# Patient Record
Sex: Female | Born: 1955
Health system: Southern US, Community
[De-identification: ages and names within clinical notes are randomized; demographics above are authoritative.]

## PROBLEM LIST (undated history)

## (undated) DIAGNOSIS — K219 Gastro-esophageal reflux disease without esophagitis: Secondary | ICD-10-CM

## (undated) DIAGNOSIS — C801 Malignant (primary) neoplasm, unspecified: Secondary | ICD-10-CM

## (undated) DIAGNOSIS — E785 Hyperlipidemia, unspecified: Secondary | ICD-10-CM

## (undated) HISTORY — DX: Malignant (primary) neoplasm, unspecified: C80.1

## (undated) HISTORY — DX: Gastro-esophageal reflux disease without esophagitis: K21.9

## (undated) HISTORY — DX: Hyperlipidemia, unspecified: E78.5

---

## 2010-10-15 ENCOUNTER — Encounter: Payer: Self-pay | Admitting: Family Medicine

## 2010-10-15 ENCOUNTER — Ambulatory Visit (INDEPENDENT_AMBULATORY_CARE_PROVIDER_SITE_OTHER): Payer: Self-pay | Admitting: Family Medicine

## 2010-10-15 VITALS — BP 109/71 | HR 75 | Ht 59.0 in | Wt 108.0 lb

## 2010-10-15 DIAGNOSIS — R319 Hematuria, unspecified: Secondary | ICD-10-CM

## 2010-10-15 DIAGNOSIS — Z23 Encounter for immunization: Secondary | ICD-10-CM

## 2010-10-15 DIAGNOSIS — Z1231 Encounter for screening mammogram for malignant neoplasm of breast: Secondary | ICD-10-CM

## 2010-10-15 DIAGNOSIS — R0789 Other chest pain: Secondary | ICD-10-CM

## 2010-10-15 LAB — POCT URINALYSIS DIPSTICK
Nitrite, UA: NEGATIVE
Protein, UA: 30
Urobilinogen, UA: 0.2
pH, UA: 6

## 2010-10-15 NOTE — Patient Instructions (Signed)
Can try zantac 75mg  twice a day for 2 weeks and see if helps your chest pain Avoid greasy or spicey foods. Avoid caffeine or chocolate or carbonated beverages.

## 2010-10-15 NOTE — Progress Notes (Signed)
Subjective:    Patient ID: Haley Francis, female    DOB: [birth year], 55 y.o.   MRN: [id number]  HPI Left and mid chest pain that like a pressure.  Occuring for about a month.  Pain is intermittant and can last 2-5 mintues. No SOB. No sweats. Can happen at rest or activity.  Mom with high blood pressure.  It varies, not necesaril happens after eating.  Never had cholesterol check. He can happen once or twice a week. If not daily. No other worsening or alleviating symptoms that she is noticed. She denies any heartburn-like symptoms. She does speak some English but her daughter is here with her today helping translate. She denies any blood in her stool but did admit to some blood after urination when she wipes. She says sometimes it will paper is pink tinged.   Review of Systems  Constitutional: Negative for fever, diaphoresis and unexpected weight change.  HENT: Negative for hearing loss, rhinorrhea and tinnitus.   Eyes: Negative for visual disturbance.  Respiratory: Negative for cough and wheezing.   Cardiovascular: Negative for chest pain and palpitations.  Gastrointestinal: Negative for nausea, vomiting, diarrhea and blood in stool.  Genitourinary: Negative for vaginal bleeding, vaginal discharge and difficulty urinating.  Musculoskeletal: Negative for myalgias and arthralgias.  Skin: Negative for rash.  Neurological: Negative for headaches.  Hematological: Negative for adenopathy. Does not bruise/bleed easily.  Psychiatric/Behavioral: Negative for sleep disturbance and dysphoric mood. The patient is not nervous/anxious.        BP 109/71  Pulse 75  Ht 4\' 11"  (1.499 m)  Wt 108 lb (48.988 kg)  BMI 21.81 kg/m2    No Known Allergies  History reviewed. No pertinent past medical history.  History reviewed. No pertinent past surgical history.  History   Social History  . Marital Status: Married    Spouse Name: Safiyah Cisney    Number of Children: 2  . Years of Education: HA    Occupational History  . Manicurist     [employer]   Social History Main Topics  . Smoking status: Never Smoker   . Smokeless tobacco: Not on file  . Alcohol Use: 0.6 oz/week    1 Cans of beer per week  . Drug Use: No  . Sexually Active: Yes -- Female partner(s)   Other Topics Concern  . Not on file   Social History Narrative   Exercise occasional.    Family History  Problem Relation Age of Onset  . Hypertension Mother     Ms. Rozycki does not currently have medications on file.  Objective:   Physical Exam  Constitutional: She is oriented to person, place, and time.  HENT:  Head: Normocephalic and atraumatic.  Mouth/Throat: Oropharynx is clear and moist.  Eyes: Conjunctivae are normal. Pupils are equal, round, and reactive to light.  Neck: Neck supple. No thyromegaly present.  Cardiovascular: Normal rate, regular rhythm and normal heart sounds.        No carotid bruits. No abdominal bruits. DP pulses 2+ bilat. Radial pulses 2+ bilat.   Pulmonary/Chest: Effort normal and breath sounds normal.  Abdominal: Soft. Bowel sounds are normal. She exhibits no distension and no mass. There is no tenderness. There is no rebound and no guarding.  Musculoskeletal: She exhibits no edema.  Lymphadenopathy:    She has no cervical adenopathy.  Neurological: She is alert and oriented to person, place, and time.  Skin: Skin is warm and dry.  Psychiatric: She has a normal mood and  affect. Her behavior is normal. Judgment and thought content normal.          Assessment & Plan:  Atypical Chest Pain-she is fairly low risk. No family hx. BP is well controlled. Will check cholesterol and CMP. EKG today shows weight 60 beats per minute with normal sinus rhythm. No evidence of acute changes. She does have some poor R. wave progression in the lateral leads. She has no premature family history of heart disease. Consider this also could be GERD. I did give her some instructions to try Zantac 75  milligrams twice a day for one to 2 weeks to see if that improves her symptoms.   Hematuria - UA is positive for blood. Sent for culture. If this is negative then she will need further workup for her hematuria.Marland Kitchen

## 2010-10-16 LAB — COMPLETE METABOLIC PANEL WITH GFR
ALT: 20 U/L (ref 0–35)
AST: 23 U/L (ref 0–37)
Alkaline Phosphatase: 69 U/L (ref 39–117)
BUN: 15 mg/dL (ref 6–23)
Calcium: 9.3 mg/dL (ref 8.4–10.5)
Chloride: 105 mEq/L (ref 96–112)
Creat: 0.6 mg/dL (ref 0.50–1.10)
Total Bilirubin: 0.3 mg/dL (ref 0.3–1.2)

## 2010-10-16 LAB — LIPID PANEL
Cholesterol: 217 mg/dL — ABNORMAL HIGH (ref 0–200)
HDL: 43 mg/dL (ref >39)
Triglycerides: 110 mg/dL (ref <150)
VLDL: 22 mg/dL (ref 0–40)

## 2010-10-17 ENCOUNTER — Telehealth: Payer: Self-pay | Admitting: *Deleted

## 2010-10-17 NOTE — Telephone Encounter (Signed)
Notified pt via her daughter as pt does not understand english very well. KJ LPN

## 2010-10-17 NOTE — Telephone Encounter (Signed)
Message copied by Lanae Crumbly on Wed Oct 17, 2010 10:21 AM ------      Message from: Nani Gasser D      Created: Tue Oct 16, 2010  1:06 PM       Complete metabolic panel is normal. Sugar, liver and kidney function are normal. Her total cholesterol and LDL cholesterol are elevated. I would like her to work on low-fat diet and regular exercise .  She can start with brisk walking for 30 minutes 5 days per week did not like to recheck her LDL in 4 months. Thyroid looks great. We'll have the results back on her urine culture yet but we will call her with that.

## 2010-10-22 ENCOUNTER — Other Ambulatory Visit: Payer: Self-pay | Admitting: Family Medicine

## 2010-10-22 LAB — URINE CULTURE: Colony Count: >=100000

## 2010-10-22 MED ORDER — NITROFURANTOIN MONOHYD MACRO 100 MG PO CAPS: 100.0000 mg | ORAL_CAPSULE | Freq: Two times a day (BID) | ORAL | Status: AC

## 2010-10-23 ENCOUNTER — Telehealth: Payer: Self-pay | Admitting: *Deleted

## 2010-10-23 NOTE — Telephone Encounter (Signed)
Pt daughter calls and states mom is having chest pain left sided, SOB, started yesterday. Feels like pressure on the chest. Instructed daughter to take her to the ED and daughter agrees. Just giving you FYI on what was instructed.

## 2010-11-13 ENCOUNTER — Inpatient Hospital Stay: Admission: RE | Admit: 2010-11-13 | Payer: BC Managed Care – PPO | Source: Ambulatory Visit

## 2010-12-11 ENCOUNTER — Ambulatory Visit: Payer: BC Managed Care – PPO

## 2013-02-10 ENCOUNTER — Ambulatory Visit: Payer: BC Managed Care – PPO | Admitting: Physician Assistant

## 2013-02-17 ENCOUNTER — Encounter: Payer: Self-pay | Admitting: Physician Assistant

## 2013-02-17 ENCOUNTER — Ambulatory Visit (INDEPENDENT_AMBULATORY_CARE_PROVIDER_SITE_OTHER): Payer: BC Managed Care – PPO

## 2013-02-17 ENCOUNTER — Ambulatory Visit (INDEPENDENT_AMBULATORY_CARE_PROVIDER_SITE_OTHER): Payer: BC Managed Care – PPO | Admitting: Physician Assistant

## 2013-02-17 VITALS — BP 115/69 | HR 71 | Ht 59.0 in | Wt 110.0 lb

## 2013-02-17 DIAGNOSIS — R21 Rash and other nonspecific skin eruption: Secondary | ICD-10-CM

## 2013-02-17 DIAGNOSIS — Z1239 Encounter for other screening for malignant neoplasm of breast: Secondary | ICD-10-CM

## 2013-02-17 DIAGNOSIS — C189 Malignant neoplasm of colon, unspecified: Secondary | ICD-10-CM

## 2013-02-17 DIAGNOSIS — R7301 Impaired fasting glucose: Secondary | ICD-10-CM

## 2013-02-17 DIAGNOSIS — R0781 Pleurodynia: Secondary | ICD-10-CM

## 2013-02-17 DIAGNOSIS — Z131 Encounter for screening for diabetes mellitus: Secondary | ICD-10-CM

## 2013-02-17 DIAGNOSIS — R079 Chest pain, unspecified: Secondary | ICD-10-CM

## 2013-02-17 DIAGNOSIS — Z23 Encounter for immunization: Secondary | ICD-10-CM

## 2013-02-17 DIAGNOSIS — Z13220 Encounter for screening for lipoid disorders: Secondary | ICD-10-CM

## 2013-02-17 LAB — COMPLETE METABOLIC PANEL WITH GFR
ALBUMIN: 4.5 g/dL (ref 3.5–5.2)
ALT: 21 U/L (ref 0–35)
AST: 20 U/L (ref 0–37)
Alkaline Phosphatase: 62 U/L (ref 39–117)
BILIRUBIN TOTAL: 0.4 mg/dL (ref 0.2–1.2)
BUN: 11 mg/dL (ref 6–23)
CO2: 27 mEq/L (ref 19–32)
Calcium: 9.7 mg/dL (ref 8.4–10.5)
Chloride: 106 mEq/L (ref 96–112)
Creat: 0.62 mg/dL (ref 0.50–1.10)
GLUCOSE: 100 mg/dL — AB (ref 70–99)
Potassium: 5.3 mEq/L (ref 3.5–5.3)
Sodium: 142 mEq/L (ref 135–145)
Total Protein: 7.3 g/dL (ref 6.0–8.3)

## 2013-02-17 LAB — LIPID PANEL
Cholesterol: 234 mg/dL — ABNORMAL HIGH (ref 0–200)
HDL: 47 mg/dL (ref >39)
LDL Cholesterol: 145 mg/dL — ABNORMAL HIGH (ref 0–99)
Total CHOL/HDL Ratio: 5 Ratio
Triglycerides: 212 mg/dL — ABNORMAL HIGH (ref <150)
VLDL: 42 mg/dL — ABNORMAL HIGH (ref 0–40)

## 2013-02-17 MED ORDER — TRIAMCINOLONE ACETONIDE 0.5 % EX OINT: 1.0000 "application " | TOPICAL_OINTMENT | Freq: Two times a day (BID) | CUTANEOUS | Status: DC

## 2013-02-17 NOTE — Patient Instructions (Addendum)
Vitamin D [id number] Calicum 1200mg    Schedule CPE in next 4-6 weeks.

## 2013-02-17 NOTE — Progress Notes (Signed)
Subjective:    Patient ID: Haley Francis, female    DOB: [birth year], 58 y.o.   MRN: [id number]  HPI Patient is a 58 year old Asian female who presents to the clinic with her daughter to establish care today. Patient has not seen the doctor in a few years and knows she is in need for health maintenance items. Patient only takes a multivitamin. She has had cholesterol done in the past and was borderline but has not needed to start any medication. She does not remember her last mammogram or Pap smear. She has not had a colonoscopy yet. She denies any fractures or family history of osteoporosis.  . Active Ambulatory Problems    Diagnosis Date Noted  . No Active Ambulatory Problems   Resolved Ambulatory Problems    Diagnosis Date Noted  . No Resolved Ambulatory Problems   No Additional Past Medical History   . Family History  Problem Relation Age of Onset  . Hypertension Mother   . Diabetes Mother   . Hyperlipidemia Mother     . History   Social History  . Marital Status: Married    Spouse Name: Blanca Carreon    Number of Children: 2  . 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 Smoking status: Never Smoker   . Smokeless tobacco: Not on file  . Alcohol Use: No  . Drug Use: No  . Sexual Activity: Not on file   Other Topics Concern  . Not on file   Social History Narrative   Exercise occasional.    patient does have 2 concerns she would like addressed today. She's had on and off right rib pain. This pain is resolved today. She has no trigger for when it comes and goes. When it is there any discharge and tender to touch and then it will go away. Denies any nausea or vomiting associated. Tried anything to make better and nothing seems to make worse. This has been going on for approximately a year.   Patient also has an itchy rash of her right lower anterior shin. It has been present for approximately a month. It  is very itchy. Hot bath seems to make worse. She has not tried anything to make better. She denies any pain or burning sensation. She has not tried any new detergents or soaps. She does not really use any lotions. She denies any other rashes anywhere else on her body.   Review of Systems  All other systems reviewed and are negative.       Objective:   Physical Exam  Constitutional: She is oriented to person, place, and time. She appears well-developed and well-nourished.  HENT:  Head: Normocephalic and atraumatic.  Cardiovascular: Normal rate, regular rhythm and normal heart sounds.   Pulmonary/Chest: Effort normal and breath sounds normal.  No pain to palpation over either rib cage or intercostal spaces to palpation.  Neurological: She is alert and oriented to person, place, and time.  Skin: Skin is dry.     Psychiatric: She has a normal mood and affect. Her behavior is normal.          Assessment & Plan:  Establish- will get a CMP as well as lipid levels done today. Went ahead and ordered colonoscopy and mammogram. Patient has not own for the last 12 months. Patient denies any feelings of hopelessness or helplessness and scored 0/2 on PHQ-2. Needs  CPE in next 4 weeks.   Right sided rib pain- likely a normal variant will get rib x-rays today to rule out any masses. Will call patient with results. Followup if pain becomes more constant or changes.  Rash- I will treat for eczema like rash with triamcinolone. If not improving in next 2 weeks follow up. Call if worsens. Gave HO for eczema. Decrease hot showers and baths. Instructed pt to use cetaphil and non drying lotions.    Tdap given today without complications.

## 2013-02-22 NOTE — Addendum Note (Signed)
Addended by: Jamesetta So on: 02/22/2013 08:49 AM   Modules accepted: Orders

## 2013-02-25 ENCOUNTER — Ambulatory Visit (INDEPENDENT_AMBULATORY_CARE_PROVIDER_SITE_OTHER): Payer: BC Managed Care – PPO

## 2013-02-25 DIAGNOSIS — Z1239 Encounter for other screening for malignant neoplasm of breast: Secondary | ICD-10-CM

## 2013-02-25 DIAGNOSIS — Z1231 Encounter for screening mammogram for malignant neoplasm of breast: Secondary | ICD-10-CM

## 2013-02-25 DIAGNOSIS — R928 Other abnormal and inconclusive findings on diagnostic imaging of breast: Secondary | ICD-10-CM

## 2013-02-25 LAB — HEMOGLOBIN A1C
Hgb A1c MFr Bld: 6.2 % — ABNORMAL HIGH (ref <5.7)
MEAN PLASMA GLUCOSE: 131 mg/dL — AB (ref <117)

## 2013-02-28 ENCOUNTER — Encounter: Payer: Self-pay | Admitting: Physician Assistant

## 2013-02-28 DIAGNOSIS — R928 Other abnormal and inconclusive findings on diagnostic imaging of breast: Secondary | ICD-10-CM | POA: Insufficient documentation

## 2013-03-01 ENCOUNTER — Other Ambulatory Visit: Payer: Self-pay | Admitting: Physician Assistant

## 2013-03-01 DIAGNOSIS — R928 Other abnormal and inconclusive findings on diagnostic imaging of breast: Secondary | ICD-10-CM

## 2013-03-19 ENCOUNTER — Encounter: Payer: Self-pay | Admitting: Physician Assistant

## 2013-03-19 DIAGNOSIS — D126 Benign neoplasm of colon, unspecified: Secondary | ICD-10-CM | POA: Insufficient documentation

## 2013-03-29 ENCOUNTER — Other Ambulatory Visit: Payer: BC Managed Care – PPO

## 2013-04-08 ENCOUNTER — Ambulatory Visit
Admission: RE | Admit: 2013-04-08 | Discharge: 2013-04-08 | Disposition: A | Discharge status: Home / Self Care | Payer: BC Managed Care – PPO | Source: Ambulatory Visit | Attending: Physician Assistant | Admitting: Physician Assistant

## 2013-04-08 DIAGNOSIS — R928 Other abnormal and inconclusive findings on diagnostic imaging of breast: Secondary | ICD-10-CM

## 2013-04-09 ENCOUNTER — Encounter: Payer: Self-pay | Admitting: Physician Assistant

## 2013-04-14 ENCOUNTER — Encounter: Payer: Self-pay | Admitting: Physician Assistant

## 2013-06-23 ENCOUNTER — Encounter: Payer: Self-pay | Admitting: Physician Assistant

## 2013-06-23 ENCOUNTER — Other Ambulatory Visit (HOSPITAL_COMMUNITY)
Admission: RE | Admit: 2013-06-23 | Discharge: 2013-06-23 | Disposition: A | Discharge status: Home / Self Care | Payer: BC Managed Care – PPO | Source: Ambulatory Visit | Attending: Family Medicine | Admitting: Family Medicine

## 2013-06-23 ENCOUNTER — Ambulatory Visit (INDEPENDENT_AMBULATORY_CARE_PROVIDER_SITE_OTHER): Payer: BC Managed Care – PPO | Admitting: Physician Assistant

## 2013-06-23 VITALS — BP 92/61 | HR 66 | Ht 59.0 in | Wt 106.0 lb

## 2013-06-23 DIAGNOSIS — N760 Acute vaginitis: Secondary | ICD-10-CM | POA: Insufficient documentation

## 2013-06-23 DIAGNOSIS — L293 Anogenital pruritus, unspecified: Secondary | ICD-10-CM

## 2013-06-23 DIAGNOSIS — Z13820 Encounter for screening for osteoporosis: Secondary | ICD-10-CM

## 2013-06-23 DIAGNOSIS — R7309 Other abnormal glucose: Secondary | ICD-10-CM

## 2013-06-23 DIAGNOSIS — Z01419 Encounter for gynecological examination (general) (routine) without abnormal findings: Secondary | ICD-10-CM

## 2013-06-23 DIAGNOSIS — R87810 Cervical high risk human papillomavirus (HPV) DNA test positive: Secondary | ICD-10-CM | POA: Insufficient documentation

## 2013-06-23 DIAGNOSIS — Z1151 Encounter for screening for human papillomavirus (HPV): Secondary | ICD-10-CM | POA: Insufficient documentation

## 2013-06-23 DIAGNOSIS — N898 Other specified noninflammatory disorders of vagina: Secondary | ICD-10-CM

## 2013-06-23 DIAGNOSIS — Z0000 Encounter for general adult medical examination without abnormal findings: Secondary | ICD-10-CM

## 2013-06-23 DIAGNOSIS — Z124 Encounter for screening for malignant neoplasm of cervix: Secondary | ICD-10-CM | POA: Insufficient documentation

## 2013-06-23 DIAGNOSIS — R7303 Prediabetes: Secondary | ICD-10-CM | POA: Insufficient documentation

## 2013-06-23 LAB — POCT GLYCOSYLATED HEMOGLOBIN (HGB A1C): Hemoglobin A1C: 6.1

## 2013-06-23 NOTE — Patient Instructions (Signed)

## 2013-06-24 LAB — CYTOLOGY - PAP

## 2013-06-25 NOTE — Progress Notes (Signed)
  Subjective:     Haley Francis is a 58 y.o. female and is here for a comprehensive physical exam. The patient reports vaginal itching for past couple of days. no pain with urination, no discharge, no abdominal or flank pain. place alcohol in vagina and did not make worse but did not help. Marland Kitchen  History   Social History  . Marital Status: Married    Spouse Name: Dinara Lupu    Number of Children: 2  . 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 Smoking status: Never Smoker   . Smokeless tobacco: Not on file  . Alcohol Use: No  . Drug Use: No  . Sexual Activity: Not on file   Other Topics Concern  . Not on file   Social History Narrative   Exercise occasional.   Health Maintenance  Topic Date Due  . Influenza Vaccine  08/14/2013  . Mammogram  04/09/2014  . Pap Smear  06/23/2016  . Colonoscopy  03/17/2018  . Tetanus/tdap  02/18/2023    The following portions of the patient's history were reviewed and updated as appropriate: allergies, current medications, past family history, past medical history, past social history, past surgical history and problem list.  Review of Systems A comprehensive review of systems was negative.   Objective:    BP 92/61  Pulse 66  Ht 4\' 11"  (1.499 m)  Wt 106 lb (48.081 kg)  BMI 21.40 kg/m2 General appearance: alert, cooperative and appears stated age Head: Normocephalic, without obvious abnormality, atraumatic Eyes: conjunctivae/corneas clear. PERRL, EOM's intact. Fundi benign. Ears: normal TM's and external ear canals both ears Nose: Nares normal. Septum midline. Mucosa normal. No drainage or sinus tenderness. Throat: lips, mucosa, and tongue normal; teeth and gums normal Neck: no adenopathy, no carotid bruit, no JVD, supple, symmetrical, trachea midline and thyroid not enlarged, symmetric, no tenderness/mass/nodules Back: symmetric, no curvature. ROM normal. No CVA  tenderness. Lungs: clear to auscultation bilaterally Breasts: normal appearance, no masses or tenderness Heart: regular rate and rhythm, S1, S2 normal, no murmur, click, rub or gallop Abdomen: soft, non-tender; bowel sounds normal; no masses,  no organomegaly Pelvic: cervix normal in appearance, external genitalia normal, no adnexal masses or tenderness, no cervical motion tenderness, uterus normal size, shape, and consistency and vagina normal without discharge Extremities: extremities normal, atraumatic, no cyanosis or edema Pulses: 2+ and symmetric Skin: Skin color, texture, turgor normal. No rashes or lesions Lymph nodes: Cervical, supraclavicular, and axillary nodes normal. Neurologic: Grossly normal    Assessment:    Healthy female exam.      Plan:    CPE- pap done today. Added wet prep. Discussed itching could be more hormonal and from dryness. Alcohol would only make worse. Suggested OTC estoven or coconut oil. Follow up if not improving or worsening. Patient has an up to date colonoscopy and mammogram. Pt has never had bone density will order today. Continue on calcium and vitamin D as already on via med list. Continue regular exercise and followup as needed or in one year.  Pre-diabetes- .Marland Kitchen Lab Results  Component Value Date   HGBA1C 6.1 06/23/2013   discussed with pt slightly better from last visit. Continue to work on decreasing carbs/sugars and exercise. Will monitor every 6 months to a year.  See After Visit Summary for Counseling Recommendations

## 2013-06-29 ENCOUNTER — Other Ambulatory Visit: Payer: Self-pay | Admitting: Physician Assistant

## 2013-06-29 DIAGNOSIS — IMO0002 Reserved for concepts with insufficient information to code with codable children: Secondary | ICD-10-CM

## 2013-12-03 ENCOUNTER — Other Ambulatory Visit: Payer: Self-pay | Admitting: Physician Assistant

## 2013-12-03 ENCOUNTER — Encounter: Payer: Self-pay | Admitting: Physician Assistant

## 2013-12-03 ENCOUNTER — Ambulatory Visit (INDEPENDENT_AMBULATORY_CARE_PROVIDER_SITE_OTHER): Payer: BC Managed Care – PPO | Admitting: Physician Assistant

## 2013-12-03 VITALS — BP 112/69 | HR 82 | Ht 59.0 in | Wt 101.0 lb

## 2013-12-03 DIAGNOSIS — R7303 Prediabetes: Secondary | ICD-10-CM

## 2013-12-03 DIAGNOSIS — R7309 Other abnormal glucose: Secondary | ICD-10-CM

## 2013-12-03 DIAGNOSIS — R21 Rash and other nonspecific skin eruption: Secondary | ICD-10-CM | POA: Diagnosis not present

## 2013-12-03 NOTE — Patient Instructions (Addendum)
Biopsy Care After Refer to this sheet in the next few weeks. These instructions provide you with information on caring for yourself after your procedure. Your caregiver may also give you more specific instructions. Your treatment has been planned according to current medical practices, but problems sometimes occur. Call your caregiver if you have any problems or questions after your procedure. If you had a fine needle biopsy, you may have soreness at the biopsy site for 1 to 2 days. If you had an open biopsy, you may have soreness at the biopsy site for 3 to 4 days. HOME CARE INSTRUCTIONS   You may resume normal diet and activities as directed.  Change bandages (dressings) as directed. If your wound was closed with a skin glue (adhesive), it will wear off and begin to peel in 7 days.  Only take over-the-counter or prescription medicines for pain, discomfort, or fever as directed by your caregiver.  Ask your caregiver when you can bathe and get your wound wet. SEEK IMMEDIATE MEDICAL CARE IF:   You have increased bleeding (more than a small spot) from the biopsy site.  You notice redness, swelling, or increasing pain at the biopsy site.  You have pus coming from the biopsy site.  You have a fever.  You notice a bad smell coming from the biopsy site or dressing.  You have a rash, have difficulty breathing, or have any allergic problems. MAKE SURE YOU:   Understand these instructions.  Will watch your condition.  Will get help right away if you are not doing well or get worse. Document Released: 07/20/2004 Document Revised: 03/25/2011 Document Reviewed: 06/28/2010 ExitCare Patient Information 2015 [employer]. This information is not intended to replace advice given to you by your health care provider. Make sure you discuss any questions you have with your health care provider.  

## 2013-12-04 LAB — HEMOGLOBIN A1C
Hgb A1c MFr Bld: 6.2 % — ABNORMAL HIGH (ref <5.7)
Mean Plasma Glucose: 131 mg/dL — ABNORMAL HIGH (ref <117)

## 2013-12-06 MED ORDER — BETAMETHASONE DIPROPIONATE 0.05 % EX CREA: TOPICAL_CREAM | Freq: Two times a day (BID) | CUTANEOUS | Status: DC

## 2013-12-06 NOTE — Progress Notes (Signed)
   Subjective:    Patient ID: Haley Francis, female    DOB: [birth year], 58 y.o.   MRN: [id number]  HPI Pt presents to follow up on rash on right anterior leg. Steroid cream does help. Usually uses for one week and rash may go away for 1-2 months. Feels like rash is spreading now. Somewhat itchy.   Pre-diabetes- she has made some changes to diet and exercise. Wanted to follow up.   Review of Systems  All other systems reviewed and are negative.      Objective:   Physical Exam  Constitutional: She is oriented to person, place, and time. She appears well-developed and well-nourished.  HENT:  Head: Normocephalic and atraumatic.  Cardiovascular: Normal rate, regular rhythm and normal heart sounds.   Pulmonary/Chest: Effort normal and breath sounds normal.  Neurological: She is alert and oriented to person, place, and time.  Skin: Skin is dry.     Psychiatric: She has a normal mood and affect. Her behavior is normal.          Assessment & Plan:  Pre-diabetes- not quite 6 months. Will check A1C.   Rash- appears to be eczema vs psorasis skin eruption. Steroid cream does work. Seems to be getting bigger with every come back. Will biopsy today to make 100 percent sure. Punch biopsy done.   Punch Biopsy Procedure Note  Pre-operative Diagnosis: thick raised erythematous rash   Post-operative Diagnosis: same  Locations: right anterior shin  Indications: inflamed.   Anesthesia: not required without added sodium bicarbonate  Procedure Details  History of allergy to iodine: no Patient informed of the risks (including bleeding and infection) and benefits of the  procedure and Verbal informed consent obtained.  The lesion and surrounding area was given a sterile prep using alcohol and draped in the usual sterile fashion. The skin was then stretched perpendicular to the skin tension lines and the lesion removed using the 38mm punch.Antibiotic ointment and a sterile dressing applied.  The specimen was sent for pathologic examination. The patient tolerated the procedure well.  EBL: scant ml   Condition: Stable  Complications: none.  Plan: 1. Instructed to keep the wound dry and covered for 24-48h and clean thereafter. 2. Warning signs of infection were reviewed.   3. Recommended that the patient use OTC acetaminophen as needed for pain.

## 2014-01-10 ENCOUNTER — Other Ambulatory Visit: Payer: Self-pay | Admitting: Physician Assistant

## 2014-01-10 DIAGNOSIS — R21 Rash and other nonspecific skin eruption: Secondary | ICD-10-CM

## 2014-10-14 ENCOUNTER — Other Ambulatory Visit: Payer: Self-pay | Admitting: Family Medicine

## 2014-10-14 DIAGNOSIS — C801 Malignant (primary) neoplasm, unspecified: Secondary | ICD-10-CM

## 2014-10-14 DIAGNOSIS — IMO0002 Reserved for concepts with insufficient information to code with codable children: Secondary | ICD-10-CM

## 2014-10-14 DIAGNOSIS — C787 Secondary malignant neoplasm of liver and intrahepatic bile duct: Secondary | ICD-10-CM

## 2014-10-17 ENCOUNTER — Telehealth: Payer: Self-pay | Admitting: Family Medicine

## 2014-10-17 NOTE — Telephone Encounter (Signed)
Please disregard this message. Jenny Reichmann has taken care of this. Thank you

## 2014-10-17 NOTE — Telephone Encounter (Signed)
Haley Francis with [facility] called.  Our patient, has been referred to them by [facility]. 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 She will be seeing Dr Genia Del.  They will need a referral from [place] because we are her pcp. Thank you.

## 2014-10-18 ENCOUNTER — Ambulatory Visit: Payer: 59 | Admitting: Family Medicine

## 2014-10-21 ENCOUNTER — Telehealth: Payer: Self-pay

## 2014-10-21 NOTE — Telephone Encounter (Signed)
Worthy Flank, PA-C from the [facility] called and would like to speak with Luvenia Starch about patient's care. Cell # [phone]

## 2014-10-27 ENCOUNTER — Encounter: Payer: Self-pay | Admitting: Physician Assistant

## 2014-10-27 DIAGNOSIS — R8789 Other abnormal findings in specimens from female genital organs: Secondary | ICD-10-CM | POA: Insufficient documentation

## 2014-10-27 DIAGNOSIS — D4959 Neoplasm of unspecified behavior of other genitourinary organ: Secondary | ICD-10-CM | POA: Insufficient documentation

## 2014-10-27 DIAGNOSIS — R87618 Other abnormal cytological findings on specimens from cervix uteri: Secondary | ICD-10-CM | POA: Insufficient documentation

## 2014-10-27 NOTE — Telephone Encounter (Signed)
Called added dx. Will continue to follow patient.

## 2016-03-25 ENCOUNTER — Ambulatory Visit (INDEPENDENT_AMBULATORY_CARE_PROVIDER_SITE_OTHER): Payer: BLUE CROSS/BLUE SHIELD | Admitting: Physician Assistant

## 2016-03-25 ENCOUNTER — Other Ambulatory Visit: Payer: Self-pay | Admitting: Physician Assistant

## 2016-03-25 ENCOUNTER — Encounter: Payer: Self-pay | Admitting: Physician Assistant

## 2016-03-25 VITALS — BP 97/62 | HR 97 | Ht 59.0 in | Wt 104.0 lb

## 2016-03-25 DIAGNOSIS — C531 Malignant neoplasm of exocervix: Secondary | ICD-10-CM

## 2016-03-25 DIAGNOSIS — Z1231 Encounter for screening mammogram for malignant neoplasm of breast: Secondary | ICD-10-CM | POA: Diagnosis not present

## 2016-03-25 DIAGNOSIS — Z13220 Encounter for screening for lipoid disorders: Secondary | ICD-10-CM

## 2016-03-25 DIAGNOSIS — Z1159 Encounter for screening for other viral diseases: Secondary | ICD-10-CM | POA: Diagnosis not present

## 2016-03-25 DIAGNOSIS — Z0000 Encounter for general adult medical examination without abnormal findings: Secondary | ICD-10-CM

## 2016-03-25 DIAGNOSIS — J0100 Acute maxillary sinusitis, unspecified: Secondary | ICD-10-CM

## 2016-03-25 DIAGNOSIS — Z131 Encounter for screening for diabetes mellitus: Secondary | ICD-10-CM

## 2016-03-25 DIAGNOSIS — J208 Acute bronchitis due to other specified organisms: Secondary | ICD-10-CM | POA: Diagnosis not present

## 2016-03-25 LAB — COMPLETE METABOLIC PANEL WITH GFR
ALBUMIN: 4.5 g/dL (ref 3.6–5.1)
ALK PHOS: 67 U/L (ref 33–130)
ALT: 25 U/L (ref 6–29)
AST: 27 U/L (ref 10–35)
BILIRUBIN TOTAL: 0.3 mg/dL (ref 0.2–1.2)
BUN: 13 mg/dL (ref 7–25)
CO2: 28 mmol/L (ref 20–31)
Calcium: 9.6 mg/dL (ref 8.6–10.4)
Chloride: 103 mmol/L (ref 98–110)
Creat: 0.93 mg/dL (ref 0.50–0.99)
GFR, Est African American: 77 mL/min (ref >=60)
GFR, Est Non African American: 67 mL/min (ref >=60)
Glucose, Bld: 101 mg/dL — ABNORMAL HIGH (ref 65–99)
POTASSIUM: 4.4 mmol/L (ref 3.5–5.3)
Sodium: 140 mmol/L (ref 135–146)
Total Protein: 7.7 g/dL (ref 6.1–8.1)

## 2016-03-25 LAB — CBC WITH DIFFERENTIAL/PLATELET
Basophils Absolute: 0 cells/uL (ref 0–200)
Basophils Relative: 0 %
Eosinophils Absolute: 156 cells/uL (ref 15–500)
Eosinophils Relative: 2 %
HCT: 35.7 % (ref 35.0–45.0)
HEMOGLOBIN: 11.9 g/dL (ref 11.7–15.5)
LYMPHS ABS: 858 {cells}/uL (ref 850–3900)
Lymphocytes Relative: 11 %
MCH: 29.7 pg (ref 27.0–33.0)
MCHC: 33.3 g/dL (ref 32.0–36.0)
MCV: 89 fL (ref 80.0–100.0)
MONOS PCT: 9 %
MPV: 8.8 fL (ref 7.5–12.5)
Monocytes Absolute: 702 cells/uL (ref 200–950)
NEUTROS ABS: 6084 {cells}/uL (ref 1500–7800)
Neutrophils Relative %: 78 %
PLATELETS: 270 10*3/uL (ref 140–400)
RBC: 4.01 MIL/uL (ref 3.80–5.10)
RDW: 13.7 % (ref 11.0–15.0)
WBC: 7.8 10*3/uL (ref 3.8–10.8)

## 2016-03-25 LAB — HEPATITIS C ANTIBODY: HCV Ab: NEGATIVE

## 2016-03-25 LAB — LIPID PANEL
CHOL/HDL RATIO: 4.7 ratio (ref <5.0)
Cholesterol: 191 mg/dL (ref <200)
HDL: 41 mg/dL — AB (ref >50)
LDL Cholesterol: 125 mg/dL — ABNORMAL HIGH (ref <100)
Triglycerides: 123 mg/dL (ref <150)
VLDL: 25 mg/dL (ref <30)

## 2016-03-25 MED ORDER — AZITHROMYCIN 250 MG PO TABS: ORAL_TABLET | ORAL | 0 refills | Status: DC

## 2016-03-25 NOTE — Progress Notes (Signed)
Subjective:     Haley Francis is a 61 y.o. female and is here for a comprehensive physical exam. The patient reports problems - pt has had a cough for 5 weeks. she was seen by oncologist on 1/25 and given tessalon pearls. cough has worsened. she is now having some sinus pressure and ear pain. she denies any fever, chills, body aches. .  Social History   Social History  . Marital status: Married    Spouse name: Barbaraann Avans  . Number of children: 2  . 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 Smoking status: Never Smoker  . Smokeless tobacco: Never Used  . Alcohol use No  . Drug use: No  . Sexual activity: Not on file   Other Topics Concern  . Not on file   Social History Narrative   Exercise occasional.   Health Maintenance  Topic Date Due  . Hepatitis C Screening  [birth year]  . HIV Screening  06/08/1969  . MAMMOGRAM  04/09/2014  . INFLUENZA VACCINE  03/25/2017 (Originally 08/15/2015)  . PAP SMEAR  06/23/2016  . COLONOSCOPY  03/17/2018  . TETANUS/TDAP  02/18/2023       Review of Systems A comprehensive review of systems was negative.   Objective:    BP 97/62   Pulse 97   Ht 4\' 11"  (1.499 m)   Wt 104 lb (47.2 kg)   BMI 21.01 kg/m  General appearance: alert, cooperative and appears stated age Head: Normocephalic, without obvious abnormality, atraumatic Eyes: conjunctivae/corneas clear. PERRL, EOM's intact. Fundi benign. Ears: normal TM's and external ear canals both ears Nose: Nares normal. Septum midline. Mucosa normal. No drainage or sinus tenderness., sinus tenderness bilateral Throat: lips, mucosa, and tongue normal; teeth and gums normal Neck: no adenopathy, no carotid bruit, no JVD, supple, symmetrical, trachea midline and thyroid not enlarged, symmetric, no tenderness/mass/nodules Back: symmetric, no curvature. ROM normal. No CVA tenderness. Lungs: bilateral rhonchi.no wheezing.  Heart:  regular rate and rhythm, S1, S2 normal, no murmur, click, rub or gallop Abdomen: soft, non-tender; bowel sounds normal; no masses,  no organomegaly Extremities: extremities normal, atraumatic, no cyanosis or edema Pulses: 2+ and symmetric Skin: Skin color, texture, turgor normal. No rashes or lesions Lymph nodes: Cervical, supraclavicular, and axillary nodes normal. Neurologic: Alert and oriented X 3, normal strength and tone. Normal symmetric reflexes. Normal coordination and gait    Assessment:    Healthy female exam.      Plan:    Marland KitchenMarland KitchenNyemah was seen today for annual exam.  Diagnoses and all orders for this visit:  Routine physical examination -     Lipid panel -     COMPLETE METABOLIC PANEL WITH GFR -     CBC with Differential/Platelet -     Hepatitis C antibody  Screening for lipid disorders -     Lipid panel  Screening for diabetes mellitus -     COMPLETE METABOLIC PANEL WITH GFR  Need for hepatitis C screening test -     Hepatitis C antibody  Visit for screening mammogram -     MM DIGITAL SCREENING BILATERAL  Acute non-recurrent maxillary sinusitis -     azithromycin (ZITHROMAX) 250 MG tablet; Take 2 tablets now and then one tablet for 4 days.  Acute bronchitis due to other specified organisms -     azithromycin (ZITHROMAX) 250 MG tablet; Take 2 tablets now and then  one tablet for 4 days.  Other orders -     Discontinue: azithromycin (ZITHROMAX) 250 MG tablet; Take 2 tablets now and then one tablet for 4 days.   Ordered mammogram.  Colonoscopy up to date.  Discussed 150 minutes of exercise a week.  1500 calorie diet.  Vitamin D 1000 units and pt is on calcium supplement.  Hep C ordered.   See After Visit Summary for Counseling Recommendations

## 2016-03-25 NOTE — Patient Instructions (Signed)

## 2016-03-27 LAB — HEMOGLOBIN A1C
HEMOGLOBIN A1C: 5.7 % — AB (ref <5.7)
Mean Plasma Glucose: 117 mg/dL

## 2016-03-29 ENCOUNTER — Telehealth: Payer: Self-pay

## 2016-03-29 MED ORDER — PREDNISONE 20 MG PO TABS: 20.0000 mg | ORAL_TABLET | Freq: Two times a day (BID) | ORAL | 0 refills | Status: DC

## 2016-03-29 NOTE — Telephone Encounter (Signed)
Pt finished z-pac today, is not feeling any better.  Please advise.

## 2016-03-29 NOTE — Telephone Encounter (Signed)
I sent prednisone for 5 days.

## 2016-03-29 NOTE — Telephone Encounter (Signed)
Notified patient.

## 2016-04-10 ENCOUNTER — Ambulatory Visit (INDEPENDENT_AMBULATORY_CARE_PROVIDER_SITE_OTHER): Payer: BLUE CROSS/BLUE SHIELD

## 2016-04-10 DIAGNOSIS — Z1231 Encounter for screening mammogram for malignant neoplasm of breast: Secondary | ICD-10-CM | POA: Diagnosis not present

## 2017-05-29 ENCOUNTER — Encounter: Payer: BLUE CROSS/BLUE SHIELD | Admitting: Physician Assistant

## 2017-06-02 ENCOUNTER — Encounter: Payer: Self-pay | Admitting: Physician Assistant

## 2017-06-02 ENCOUNTER — Ambulatory Visit (INDEPENDENT_AMBULATORY_CARE_PROVIDER_SITE_OTHER): Payer: BLUE CROSS/BLUE SHIELD | Admitting: Physician Assistant

## 2017-06-02 VITALS — BP 101/58 | HR 80 | Ht 59.0 in | Wt 106.0 lb

## 2017-06-02 DIAGNOSIS — R05 Cough: Secondary | ICD-10-CM

## 2017-06-02 DIAGNOSIS — K625 Hemorrhage of anus and rectum: Secondary | ICD-10-CM

## 2017-06-02 DIAGNOSIS — D126 Benign neoplasm of colon, unspecified: Secondary | ICD-10-CM | POA: Diagnosis not present

## 2017-06-02 DIAGNOSIS — R059 Cough, unspecified: Secondary | ICD-10-CM | POA: Insufficient documentation

## 2017-06-02 LAB — POCT HEMOGLOBIN: Hemoglobin: 11.5 g/dL — AB (ref 12.2–16.2)

## 2017-06-02 MED ORDER — HYDROCORTISONE ACE-PRAMOXINE 1-1 % RE FOAM: 1.0000 | Freq: Two times a day (BID) | RECTAL | 1 refills | Status: DC

## 2017-06-02 MED ORDER — CETIRIZINE HCL 10 MG PO TABS: 10.0000 mg | ORAL_TABLET | Freq: Every day | ORAL | 11 refills | Status: DC

## 2017-06-02 NOTE — Progress Notes (Signed)
Subjective:    Patient ID: Haley Francis, female    DOB: 1955-07-12, 62 y.o.   MRN: [id number]  HPI  Pt is a 62 yo female with prior hx of cervical cancer and colon polyp who presents today to clinic with 2 months of rectal bleeding.  At first she was noticing bright red blood every 2 weeks however in the last 2 weeks it has been weekly.  She denies any problems with bowel movements.  She is having approximately 2 bowel movements a day which are soft and denies any straining.  She denies any lower abdominal pain.  She denies any rectal itching.  She did have a colonoscopy in 2015 which showed a 4 mm polyp with suggested follow-up in 5 years.  There was noted internal hemorrhoids on colonoscopy as well.  Patient is concerned about colon cancer.  Pt does have a cough mostly just at night when she lays down. No fever, chills body aches. Not productive. Worse during the spring. Not tried anything.   .. Active Ambulatory Problems    Diagnosis Date Noted  . Abnormal mammogram 02/28/2013  . Polyp of colon, adenomatous 03/19/2013  . Pre-diabetes 06/23/2013  . Neoplasm of female genital organ 10/27/2014  . Abnormal Papanicolaou smear of cervix with positive human papilloma virus (HPV) test 10/27/2014  . Malignant neoplasm of exocervix ([place]) 03/25/2016  . Cough 06/02/2017  . Rectal bleeding 06/02/2017   Resolved Ambulatory Problems    Diagnosis Date Noted  . No Resolved Ambulatory Problems   No Additional Past Medical History      Review of Systems    see HPI.  Objective:   Physical Exam  Constitutional: She is oriented to person, place, and time. She appears well-developed and well-nourished.  HENT:  Head: Normocephalic and atraumatic.  Mouth/Throat: Oropharynx is clear and moist.  Neck: Normal range of motion. Neck supple.  Cardiovascular: Normal rate and regular rhythm.  Pulmonary/Chest: Effort normal and breath sounds normal.  Abdominal: Soft. Bowel sounds are normal. She  exhibits no distension and no mass. There is no tenderness. There is no rebound and no guarding.  Genitourinary: Rectal exam shows guaiac positive stool.  Genitourinary Comments: 57mm external hemorrhoid, non thrombosed.   Neurological: She is alert and oriented to person, place, and time.  Psychiatric: She has a normal mood and affect. Her behavior is normal.          Assessment & Plan:  Marland KitchenMarland KitchenDiagnoses and all orders for this visit:  Rectal bleeding -     POCT hemoglobin -     hydrocortisone-pramoxine (PROCTOFOAM-HC) rectal foam; Place 1 applicator rectally 2 (two) times daily.  Adenomatous polyp of colon, unspecified part of colon  Cough -     cetirizine (ZYRTEC) 10 MG tablet; Take 1 tablet (10 mg total) by mouth daily.  Other orders -     Discontinue: hydrocortisone-pramoxine (PROCTOFOAM-HC) rectal foam; Place 1 applicator rectally 2 (two) times daily.    .. Results for orders placed or performed in visit on 06/02/17  POCT hemoglobin  Result Value Ref Range   Hemoglobin 11.5 (A) 12.2 - 16.2 g/dL   On exam I did find a tiny external hemorrhoid.  It did not seem to be problematic for the patient and was not thrombosed.  She did have internal hemorrhoids noted on her previous colonoscopy 4 years ago.  I think it is reasonable for [place] to give her some hydrocortisone rectal foam to see if some of her bleeding subsides.  Due  to her previous history of colon polyp I would like for her to go ahead and get a colonoscopy a little early from her previous recommended screening.  Hemoglobin today was stable from where she was 2 years ago at 11.9.  Patient does not have the typical symptoms of hemorrhoids.  She does not have any constipation, pain, itching.  Marland Kitchen.Spent 30 minutes with patient and greater than 50 percent of visit spent counseling patient regarding treatment plan.

## 2017-06-02 NOTE — Patient Instructions (Signed)
Will schedule for colonoscopy.   B?nh tr? Hemorrhoids B?nh tr? l cc t?nh m?ch ? trong ho?c xung quanh tr?c trng ho?c h?u mn b? s?ng ln. C hai lo?i b?nh tr?:  B?nh tr? n?i. B?nh x?y ra v?i nh?ng t?nh m?ch ? ngay bn trong tr?c trng. Cc m?ch mu c th? tr?i ra ngoi v b? kch ?ng v ?au ??n.  B?nh tr? ngo?i. B?nh x?y ra ? nh?ng t?nh m?ch bn ngoi h?u mn v c th? s? th?y nh? m?t c?c s?ng ?au ho?c c?ng g?n h?u mn.  H?u h?t cc bi tr? khng gy v?n ?? nghim tr?ng v c th? ???c x? tr b?ng cch ?i?u tr? t?i nh nh? thay ??i ch? ?? ?n v thay ??i l?i s?ng. N?u ?i?u tr? t?i nh khng lm gi?m tri?u ch?ng, c th? c?n th?c hi?n cc th? thu?t lm co nh? ho?c c?t bi tr?Lourdes Sledge nhn g gy ra? B?nh ny do p l?c t?ng ln ? khu v?c h?u mn gy ra. p l?c ny c th? do nhi?u nguyn nhn khc nhau, bao g?m:  To bn.  Ph?i r?n m?nh ?? ??i ti?n.  Tiu ch?y.  [place]ang [place].  Bo ph.  Ng?i trong th?i gian di.  Nng v?t n?ng ho?c ho?t ??ng khc lm qu v? g?ng s?c.  Quan h? tnh d?c qua h?u mn.  Cc d?u hi?u ho?c tri?u ch?ng l g? Nh?ng tri?u ch?ng c?a tnh tr?ng ny bao g?m:  ?au.  Ng?a ho?c kch ?ng h?u mn.  Ch?y mu tr?c trng.  R r? phn (phn).  S?ng h?u mn.  M?t ho?c nhi?u u c?c xung quanh h?u mn.  Ch?n ?on tnh tr?ng ny nh? th? no? Tnh tr?ng ny th??ng c th? ???c ch?n ?on b?ng cch ki?m tra qua tr?c quan. Cc ki?m tra ho?c xt nghi?m khc c?ng c th? ???c th?c hi?n, ch?ng h?n nh?:  Khm vng tr?c trng b?ng tay ?eo g?ng (Khm tr?c trng b?ng ngn tay).  Khm h?u mn b?ng cch s? d?ng m?t ?ng nh? (?ng soi h?u mn).  Xt nghi?m mu, n?u qu v? b? m?t m?t l??ng mu ?ng k?.  Ki?m tra xem bn trong ??i trng (n?i soi ??i trng sigma ho?c n?i soi ??i trng).  Tnh tr?ng ny ???c ?i?u tr? nh? th? no? Tnh tr?ng ny th??ng c th? ?i?u tr? t?i nh. Tuy nhin, c th? c?n lm nhi?u th? thu?t khc nhau n?u vi?c thay ??i ch? ?? ?n u?ng, thay ??i l?i s?ng  v cc bi?n php ?i?u tr? khc t?i nh khng lm gi?m tri?u ch?ng. Nh?ng th? thu?t ny c th? gip lm cho cc bi tr? nh? h?n ho?c c?t b? chng hon ton. M?t s? th? thu?t lin quan ??n ph?u thu?t v m?t s? khc th khng. Cc th? thu?t ph? bi?n bao g?m:  Th?t tr? b?ng vng cao su. Cc vng cao su ???c ??t ? g?c cc bi tr? ?? ng?t dng mu cung c?p ??n bi tr?Marland Kitchen  Li?u php x? ha. Thu?c ???c tim vo bi tr? ?? lm chng co nh? l?i.  Lm ?ng b?ng tia h?ng ngo?i. M?t lo?i n?ng l??ng nh? ???c s? d?ng ?? lo?i b? tr?.  Ph?u thu?t c?t tr?. Bi tr? ???c c?t b? b?ng ph?u thu?t v cc t?nh m?ch cung c?p mu ??n cc bi tr? ny ???c th?t l?i.  Ph?u thu?t c?t tr? b?ng k?p. M?t d?ng c? k?p hnh trn ???c dng ?? c?t b? cc bi tr? v  dng ghim khu ph?u thu?t ng?n ngu?n cung c?p mu ??n cc bi tr?Athena Masse th? nh?ng h??ng d?n ny ? nh: ?n v u?ng  ?n th?c ?n c nhi?u ch?t x? nh? ng? c?c nguyn h?t, cc lo?i ??u, h?t, tri cy v rau. Hy h?i chuyn gia ch?m [place] s?c kh?e v? vi?c dng cc s?n ph?m c thm ch?t x? (th?c ph?m b? sung ch?t x?).  U?ng ?? n??c ?? gi? cho n??c ti?u trong ho?c c mu vng nh?t. X? tr ?au v s?ng n?  T?m ng?i b?ng n??c ?m trong 20 pht, 3-4 l?n m?i ngy ?? gi?m ?au v gi?m c?m gic kh ch?u.  N?u ???c ch? d?n, ch??m ? l?nh vo vng b? ?nh h??ng. S? d?ng cc ti ch??m l?nh gi?a nh?ng l?n t?m ng?i c th? c tc d?ng. ? Cho ? l?nh vo ti ni lng. ? ?? kh?n t?m ? gi?a da v ti ch??m. ? Ch??m ? l?nh trong 20 pht, 2-3 l?n m?i ngy. H??ng d?n chung  Ch? s? d?ng thu?c khng k ??n v thu?c k ??n theo ch? d?n c?a chuyn gia ch?m [place] s?c kh?e.  S? d?ng thu?c d?ng kem ho?c vin ??n theo ch? d?n.  T?p th? d?c th??ng xuyn.  Vo nh v? sinh khi qu v? bu?n ?i ??i ti?n. Khng ch? ??i.  Trnh r?n m?nh khi ?i ??i ti?n.  Gi? cho vng h?u mn kh v s?ch. S? d?ng gi?y v? sinh ??t ho?c kh?n gi?y v? sinh ?m sau khi ?i ??i ti?n.  Khng ng?i lu trong nh v? sinh. Vi?c ny  lm t?ng ? mu v ?au. Hy lin l?c v?i chuyn gia ch?m [place] s?c kh?e n?u:  Qu v? b? ?au v s?ng t?ng ln m khng ki?m sot ???c cch ?i?u tr? ho?c b?ng thu?c.  Qu v? b? ch?y mu khng ki?m sot ???c.  Qu v? ?i ??i ti?n kh kh?n, ho?c qu v? khng th? ??i ti?n ???c.  Qu v? b? ?au ho?c vim ? ngoi khu v?c c?a cc bi tr?Tera Mater tin ny khng nh?m m?c ?ch thay th? cho l?i khuyn m chuyn gia ch?m [place] s?c kh?e ni v?i qu v?. Hy b?o ??m qu v? ph?i th?o lu?n b?t k? v?n ?? g m qu v? c v?i chuyn gia ch?m [place] s?c kh?e c?a qu v?. Document Released: 10/10/2004 Document Revised: 04/17/2016 Document Reviewed: 09/14/2014 Elsevier Interactive Patient Education  2018 [employer].

## 2017-06-05 ENCOUNTER — Telehealth: Payer: Self-pay | Admitting: Physician Assistant

## 2017-06-05 ENCOUNTER — Other Ambulatory Visit: Payer: Self-pay | Admitting: Physician Assistant

## 2017-06-05 DIAGNOSIS — K644 Residual hemorrhoidal skin tags: Secondary | ICD-10-CM

## 2017-06-05 MED ORDER — LIDOCAINE-HYDROCORTISONE ACE 1-1 % EX CREA: 1.0000 "application " | TOPICAL_CREAM | Freq: Two times a day (BID) | CUTANEOUS | 0 refills | Status: DC

## 2017-06-05 NOTE — Telephone Encounter (Signed)
Left VM with update for Pt's daughter.

## 2017-06-05 NOTE — Telephone Encounter (Signed)
Left VM for Pt's daughter with update.

## 2017-06-05 NOTE — Telephone Encounter (Signed)
Pt's daughter called to advised the Rx for hydrocortisone-pramoxine [facility]) rectal foam is not covered by insurance. Routing to see if an alternative can be sent.

## 2017-06-05 NOTE — Telephone Encounter (Signed)
Lidocaine-Hydrocortisone cream sent

## 2017-06-05 NOTE — Telephone Encounter (Signed)
For hemorrhoids: If unable to fill prescription due to cost or backorder - instruct patient to use OTC hemorrhoid cream such as preparation H - Tucks medicated wipes - Docusate/Colace to keep stool soft - high fiber diet

## 2017-07-10 ENCOUNTER — Telehealth: Payer: Self-pay | Admitting: Physician Assistant

## 2017-07-10 DIAGNOSIS — K625 Hemorrhage of anus and rectum: Secondary | ICD-10-CM

## 2017-07-10 DIAGNOSIS — D126 Benign neoplasm of colon, unspecified: Secondary | ICD-10-CM

## 2017-07-10 NOTE — Telephone Encounter (Signed)
Do not see referral order. Routing.

## 2017-07-10 NOTE — Telephone Encounter (Signed)
Ok she is already established with digestive health so they would just need to call them but I will place new referral as well.

## 2017-07-10 NOTE — Telephone Encounter (Signed)
Janett Billow, Pt's daughter called. It's been 3 wks and Mom is still waiting to be referred for colonoscpy,  - she is a precancer pt.

## 2017-07-11 NOTE — Telephone Encounter (Signed)
Left VM for Pt's daughter with status update.

## 2017-09-10 LAB — HM COLONOSCOPY

## 2017-10-02 ENCOUNTER — Encounter: Payer: Self-pay | Admitting: Physician Assistant

## 2018-10-12 ENCOUNTER — Ambulatory Visit (INDEPENDENT_AMBULATORY_CARE_PROVIDER_SITE_OTHER): Payer: BLUE CROSS/BLUE SHIELD | Admitting: Physician Assistant

## 2018-10-12 ENCOUNTER — Other Ambulatory Visit: Payer: Self-pay

## 2018-10-12 ENCOUNTER — Ambulatory Visit (INDEPENDENT_AMBULATORY_CARE_PROVIDER_SITE_OTHER): Payer: BLUE CROSS/BLUE SHIELD

## 2018-10-12 ENCOUNTER — Encounter: Payer: Self-pay | Admitting: Physician Assistant

## 2018-10-12 VITALS — BP 119/67 | HR 67 | Ht 59.0 in | Wt 100.0 lb

## 2018-10-12 DIAGNOSIS — M5442 Lumbago with sciatica, left side: Secondary | ICD-10-CM

## 2018-10-12 DIAGNOSIS — M47816 Spondylosis without myelopathy or radiculopathy, lumbar region: Secondary | ICD-10-CM

## 2018-10-12 DIAGNOSIS — M25552 Pain in left hip: Secondary | ICD-10-CM

## 2018-10-12 DIAGNOSIS — Z1231 Encounter for screening mammogram for malignant neoplasm of breast: Secondary | ICD-10-CM

## 2018-10-12 MED ORDER — MELOXICAM 15 MG PO TABS: 15.0000 mg | ORAL_TABLET | Freq: Every day | ORAL | 1 refills | Status: DC

## 2018-10-12 NOTE — Patient Instructions (Signed)

## 2018-10-12 NOTE — Progress Notes (Signed)
Lumbar spine shows good disc space but her inflammation of facet over lower spine. This can cause some of the symptoms patient reported today. Lets try mobic for the 2 weeks and see if that helps at all.

## 2018-10-12 NOTE — Progress Notes (Signed)
No hip arthritis.

## 2018-10-12 NOTE — Progress Notes (Signed)
Subjective:    Patient ID: Haley Francis, female    DOB: [birth year], 63 y.o.   MRN: [id number]  HPI  Pt is a 63 yo female in cervical cancer remission for the last 3 years since Jan 25 2015 who is concerned with new left low back and "hip" pain for that last few months. Last pap was feb 2020 and normal. Seen by oncology on 09/17/18. She denies any lower abdominal pain, fullness or bloating. She denies any new trauma or injury. Denies any loss of strength, bowel or bladder dysfunction, saddle anesthesia. She does at time have some numbness sensation that "runs down her left lateral side" from time to time. Her left side will often feel like it is going to "lock up" when she leans forward. She denies any pain getting in or out of cards or going up stairs. She does feel stiff in low back. She has not tried anything to make better.   .. Active Ambulatory Problems    Diagnosis Date Noted  . Abnormal mammogram 02/28/2013  . Polyp of colon, adenomatous 03/19/2013  . Pre-diabetes 06/23/2013  . Neoplasm of female genital organ 10/27/2014  . Abnormal Papanicolaou smear of cervix with positive human papilloma virus (HPV) test 10/27/2014  . Malignant neoplasm of exocervix ([place]) 03/25/2016  . Cough 06/02/2017  . Rectal bleeding 06/02/2017  . Facet hypertrophy of lumbar region 10/12/2018   Resolved Ambulatory Problems    Diagnosis Date Noted  . No Resolved Ambulatory Problems   No Additional Past Medical History      Review of Systems   see HPI.  Objective:   Physical Exam Vitals signs reviewed.  Constitutional:      Appearance: Normal appearance.  HENT:     Head: Normocephalic.  Cardiovascular:     Rate and Rhythm: Normal rate.     Pulses: Normal pulses.  Pulmonary:     Effort: Pulmonary effort is normal.     Breath sounds: Normal breath sounds.  Abdominal:     General: Abdomen is flat. Bowel sounds are normal. There is no distension.     Palpations: Abdomen is soft.     Tenderness:  There is no abdominal tenderness. There is no guarding or rebound.  Musculoskeletal:     Comments: NROM at waist.  NROM at bilateral hip.  No pain to palpation over left inguinal area, greater trochanter, lumbar spine, SI joint.  Negative straight leg test.  5/5 strength of left lower extremity.    Neurological:     General: No focal deficit present.     Mental Status: She is alert and oriented to person, place, and time.  Psychiatric:        Mood and Affect: Mood normal.           Assessment & Plan:  Marland KitchenMarland KitchenSharekia was seen today for numbness.  Diagnoses and all orders for this visit:  Acute left-sided low back pain with left-sided sciatica -     DG Lumbar Spine Complete -     DG Hip Unilat W OR W/O Pelvis 2-3 Views Left -     meloxicam (MOBIC) 15 MG tablet; Take 1 tablet (15 mg total) by mouth daily.  Breast cancer screening -     MM 3D SCREEN BREAST BILATERAL  Left hip pain -     DG Lumbar Spine Complete -     DG Hip Unilat W OR W/O Pelvis 2-3 Views Left -     meloxicam (MOBIC) 15 MG tablet;  Take 1 tablet (15 mg total) by mouth daily.  Facet hypertrophy of lumbar region   Suspect some degenerative changes in low back will get xray.  Xray showed normal left hip and some facet hypertrophy in lumbar region. Good disc space.  Will add mobic for 2 weeks and see if helps.  Continue low back stretches/exercise.  May consider formal PT.  Follow up with sports medicine for further intervention. May be candidate for injections in the future if symptoms persist.     Needs mammogram. Ordered today.

## 2018-10-18 ENCOUNTER — Encounter: Payer: Self-pay | Admitting: Physician Assistant

## 2018-10-21 ENCOUNTER — Other Ambulatory Visit: Payer: Self-pay

## 2018-10-21 ENCOUNTER — Ambulatory Visit (INDEPENDENT_AMBULATORY_CARE_PROVIDER_SITE_OTHER): Payer: BLUE CROSS/BLUE SHIELD

## 2018-10-21 DIAGNOSIS — Z1231 Encounter for screening mammogram for malignant neoplasm of breast: Secondary | ICD-10-CM | POA: Diagnosis not present

## 2018-10-21 NOTE — Progress Notes (Signed)
Normal mammogram. Follow up in 1 year.

## 2018-10-23 ENCOUNTER — Telehealth: Payer: Self-pay | Admitting: Neurology

## 2018-10-23 NOTE — Telephone Encounter (Signed)
Patient's [name] left vm for call back about patient.  She had DG lumbar spine/hip on 10/12/2018. Spoke to [place] on Wednesday with following note:   Notes recorded by Teddy Spike, CMA on 10/21/2018 at 12:02 PM EDT  Spoke w/pt's daughter and advised her of recommendations. She then informed me that her mother is still having continued back pain even with taking the Mobic and it has actually gotten worse. I advised that she have her mother try an ICE pack for some of the inflammation to see if this would help in addition to her continuing with the home stretches that were recommended for her to do.and that Luvenia Starch had also recommended that she could benefit from more formal therapy like PT and that she could place the referral for this. The daughter then informed me that her mothers L hip has become more numb and is worse than what it was when she was seen 1.5 wks ago. I advised that I would fwd her concerns to [facility]. She also asked about if her mother could possibly get something stronger for her back/hip pain. Fwd to pcp for advice.Maryruth Eve, Lahoma Crocker, CMA   She has not heard back and is trying to get advise since patient getting worse. She has not been doing stretches because did not want to irritate back worse due to inflammation. Please advise.

## 2018-10-23 NOTE — Telephone Encounter (Signed)
Have her see either myself or Dr. Georgina Snell sometime early next week.

## 2018-10-26 MED ORDER — PREDNISONE 20 MG PO TABS: ORAL_TABLET | ORAL | 0 refills | Status: DC

## 2018-10-26 NOTE — Telephone Encounter (Signed)
Patients daughter made aware

## 2018-10-26 NOTE — Progress Notes (Signed)
I sent prednisone burst to start today. I order PT to start. Ok to take tylenol 1000mg  3-4 times a day to see if with prednisone helps with pain.

## 2018-10-26 NOTE — Telephone Encounter (Signed)
I called and spoke to Pts daughter and tried to schedule her with Sports med but they refused and state that they are going to see a chiropractor

## 2018-10-26 NOTE — Telephone Encounter (Signed)
Ok I sent prednisone pack and PT referral this morning as well.

## 2018-10-26 NOTE — Addendum Note (Signed)
Addended byIran Planas L on: 10/26/2018 08:00 AM   Modules accepted: Orders

## 2018-10-26 NOTE — Telephone Encounter (Signed)
Please call patient's [name] at [phone] to schedule appt with Dr. Darene Lamer or Dr. Georgina Snell. Thanks!

## 2018-10-26 NOTE — Telephone Encounter (Signed)
Jade - FYI ?

## 2018-11-12 ENCOUNTER — Other Ambulatory Visit: Payer: Self-pay | Admitting: Neurology

## 2018-11-13 NOTE — Telephone Encounter (Signed)
Patient's daughter called for refill prednisone. Made her aware that is a one time prescription. She expresses understanding. Patient has been going to chiropractor and helping some but still having pain. Starts physical therapy Monday. If that doesn't help they will make an appt to follow up.

## 2018-11-16 ENCOUNTER — Other Ambulatory Visit: Payer: Self-pay

## 2018-11-16 ENCOUNTER — Ambulatory Visit (INDEPENDENT_AMBULATORY_CARE_PROVIDER_SITE_OTHER): Payer: BLUE CROSS/BLUE SHIELD | Admitting: Physical Therapy

## 2018-11-16 DIAGNOSIS — M5416 Radiculopathy, lumbar region: Secondary | ICD-10-CM | POA: Diagnosis not present

## 2018-11-16 NOTE — Patient Instructions (Signed)
Prone back ext 2-3 sets of 10 2x/day Supine HS stretch with strap 3x 30-60 sec 2x/day ADLs/Body mechanics  DN education

## 2018-11-16 NOTE — Therapy (Signed)
[facility], [place], [address] Phone: [phone]   Fax:  [phone]  Physical Therapy Evaluation  Patient Details  Name: Haley Francis MRN: [id number] Date of Birth: 02-04-1955 Referring Provider (PT): Iran Planas   Encounter Date: 11/16/2018  PT End of Session - 11/16/18 1350    Visit Number  1    Date for PT Re-Evaluation  12/28/18    PT Start Time  1350    PT Stop Time  1430    PT Time Calculation (min)  40 min    Activity Tolerance  Patient tolerated treatment well    Behavior During Therapy  [facility] for tasks assessed/performed       No past medical history on file.  No past surgical history on file.  There were no vitals filed for this visit.   Subjective Assessment - 11/16/18 1358    Subjective  Patient had left hip numbness down her LLE starting 5 weeks ago. Prednisone helped some. Saw chiropractor x 6 visits, but now she has pain into her foot and toes. Pain is worse with sitting.    Patient is accompained by:  Family member    Pertinent History  cervical cancer 2017, allergies    Diagnostic tests  xrays    Patient Stated Goals  get rid of pain and numbness    Currently in Pain?  Yes    Pain Score  7     Pain Location  Back    Pain Orientation  Left    Pain Descriptors / Indicators  Numbness;Dull    Pain Type  Acute pain    Pain Radiating Towards  to toes    Pain Onset  More than a month ago    Pain Frequency  Constant    Aggravating Factors   sitting, standing and walking    Pain Relieving Factors  prednisone, ice, massage    Effect of Pain on Daily Activities  can't lift heavy items, normal activities are limited         [facility] PT Assessment - 11/16/18 0001      Assessment   Medical Diagnosis  left low back pain with sciatica    Referring Provider (PT)  Iran Planas    Onset Date/Surgical Date  10/12/18    Next MD Visit  no f/u    Prior Therapy  no      Precautions    Precautions  None    Precaution Comments  cervical CA 2017      Restrictions   Weight Bearing Restrictions  No      Balance Screen   Has the patient fallen in the past 6 months  No    Has the patient had a decrease in activity level because of a fear of falling?   No    Is the patient reluctant to leave their home because of a fear of falling?   No      Home Social worker  Private residence    [place] to enter    Entrance Stairs-Number of Steps  3      Prior Function   Level of Independence  Independent    Vocation  Retired      Posture/Postural Control   Posture/Postural Control  No significant limitations      ROM / Strength   AROM / PROM / Strength  AROM;Strength  AROM   Overall AROM Comments  tight HS with lumbar flex, pain with flex and right SB on left; lumbar ROM WFL but tight with rotation and right SB      Strength   Overall Strength Comments  BLE 5/5 except left hip ext 4+/5, knee flex 4+/5      Flexibility   Soft Tissue Assessment /Muscle Length  yes    Hamstrings  bil tightness    Quadriceps  pain in left with prone flex    Piriformis  tight on left      Palpation   Spinal mobility  WNL no pain reported     Marked tenderness in left piriformis           Objective measurements completed on examination: See above findings.              PT Education - 11/16/18 1648    Education Details  HEP    Person(s) Educated  Patient;Child(ren)    Methods  Explanation;Demonstration;Handout    Comprehension  Verbalized understanding;Returned demonstration;Tactile cues required       PT Short Term Goals - 11/16/18 1651      PT SHORT TERM GOAL #1   Title  Patient able to centralize LLE sx    Time  3    Period  Weeks    Status  New    Target Date  12/07/18        PT Long Term Goals - 11/16/18 1648      PT LONG TERM GOAL #1   Title  Ind with HEP for strengthening and  flexibility    Time  6    Period  Weeks    Status  New    Target Date  12/28/18      PT LONG TERM GOAL #2   Title  Patient able to sit without pain and numbness in LLE.    Time  6    Period  Weeks    Status  New      PT LONG TERM GOAL #3   Title  Patient able to walk without pain with a normal gait pattern    Time  6    Period  Weeks    Status  New      PT LONG TERM GOAL #4   Title  Patient able to perform ADLS with pain [id number] or less    Time  6    Period  Weeks    Status  New      PT LONG TERM GOAL #5   Title  Improved LLE strength to 5/5    Time  6    Period  Weeks    Status  New             Plan - 11/16/18 1653    Clinical Impression Statement  Patient presents today with c/o LLE pain and numbness x 5 weeks limiting ADLS. She is accompanied by her [name] who is translating for her. Patient has pain with lumbar flexion and right SB. She has tightness in bil HS and her right piriformis which is very tender to the touch as well. She is strong in BLE, but has weakness in lumbar stabilization. She will benefit from PT to decrease radicular sx and improve her quality of life.    Personal Factors and Comorbidities  Comorbidity 1    Comorbidities  cervical cancer 2018    Examination-Activity Limitations  Lift;Sit;Bend;Stand    Stability/Clinical Decision Making  Stable/Uncomplicated    Clinical Decision Making  Low    Rehab Potential  Good    PT Frequency  2x / week    PT Duration  6 weeks    PT Treatment/Interventions  ADLs/Self Care Home Management;Cryotherapy;Traction;Therapeutic exercise;Neuromuscular re-education;Patient/family education;Manual techniques;Dry needling;Taping    PT Next Visit Plan  Review HEP, Body mechanics and ADL modifications, lumbar stab    PT Home Exercise Plan  see pt. instructions (didn't save code, so asked pt's daughter to bring folder back)    Consulted and Agree with Plan of Care  Patient       Patient will benefit from  skilled therapeutic intervention in order to improve the following deficits and impairments:  Abnormal gait, Impaired sensation, Pain, Impaired flexibility, Decreased activity tolerance, Decreased strength  Visit Diagnosis: Radiculopathy, lumbar region - Plan: PT plan of care cert/re-cert     Problem List Patient Active Problem List   Diagnosis Date Noted  . Facet hypertrophy of lumbar region 10/12/2018  . Cough 06/02/2017  . Rectal bleeding 06/02/2017  . Malignant neoplasm of exocervix ([place]) 03/25/2016  . Neoplasm of female genital organ 10/27/2014  . Abnormal Papanicolaou smear of cervix with positive human papilloma virus (HPV) test 10/27/2014  . Pre-diabetes 06/23/2013  . Polyp of colon, adenomatous 03/19/2013  . Abnormal mammogram 02/28/2013    Madelyn Flavors PT 11/16/2018, 5:06 PM  [facility] [place], [place], [address] Phone: [phone]   Fax:  [phone]  Name: Haley Francis MRN: [id number] Date of Birth: February 13, 1955

## 2018-11-20 ENCOUNTER — Encounter: Payer: BLUE CROSS/BLUE SHIELD | Admitting: Physical Therapy

## 2018-11-23 ENCOUNTER — Other Ambulatory Visit: Payer: Self-pay

## 2018-11-23 ENCOUNTER — Ambulatory Visit (INDEPENDENT_AMBULATORY_CARE_PROVIDER_SITE_OTHER): Payer: BLUE CROSS/BLUE SHIELD | Admitting: Physical Therapy

## 2018-11-23 ENCOUNTER — Encounter: Payer: Self-pay | Admitting: Physical Therapy

## 2018-11-23 DIAGNOSIS — M5416 Radiculopathy, lumbar region: Secondary | ICD-10-CM | POA: Diagnosis not present

## 2018-11-23 NOTE — Therapy (Signed)
[place], [place], [address] Phone: [phone]   Fax:  [phone]  Physical Therapy Treatment  Patient Details  Name: Haley Francis MRN: [id number] Date of Birth: [birth year] Referring Provider (PT): Iran Planas   Encounter Date: 11/23/2018  PT End of Session - 11/23/18 1523    Visit Number  2    Date for PT Re-Evaluation  12/28/18    PT Start Time  1436    PT Stop Time  1519    PT Time Calculation (min)  43 min    Activity Tolerance  Patient tolerated treatment well    Behavior During Therapy  [facility] for tasks assessed/performed       History reviewed. No pertinent past medical history.  History reviewed. No pertinent surgical history.  There were no vitals filed for this visit.  Subjective Assessment - 11/23/18 1439    Subjective  Daughter reports that the prednisone helped to decrease her pain.  Pain is less this week than last week.    Patient is accompained by:  Family member   pt's daughter (acting as interpreter)   Pertinent History  cervical cancer 2017, allergies    Currently in Pain?  Yes    Pain Score  3     Pain Location  Back    Pain Orientation  Left    Pain Descriptors / Indicators  Numbness    Pain Type  Acute pain    Aggravating Factors   prolonged sitting    Pain Relieving Factors  ice, resting, prednisone         [facility] PT Assessment - 11/23/18 0001      Assessment   Medical Diagnosis  left low back pain with sciatica    Referring Provider (PT)  Iran Planas    Onset Date/Surgical Date  10/12/18    Next MD Visit  PRN    Prior Therapy  no       [facility] Adult PT Treatment/Exercise - 11/23/18 0001      Self-Care   Self-Care  Other Self-Care Comments    Other Self-Care Comments   Pt educated on self-massage with ball to hip and low back musculature; pt verbalized understanding and returned demo.   Pt educated on importance of lumbar support in sitting.      Exercises   Exercises  Lumbar      Lumbar Exercises: Stretches   Passive Hamstring Stretch  Right;Left;2 reps, 30 sec, supine with strap    Press Ups  4 reps;20 reps    Piriformis Stretch  Right;Left;2 reps, per HEP      Lumbar Exercises: Seated   Sit to Stand  5 reps with core engaged.      Lumbar Exercises: Supine   Ab Set  5 reps;5 seconds    Bridge  10 reps   with ab set     Lumbar Exercises: Sidelying   Clam  Left;10 reps      Manual Therapy   Manual Therapy  Soft tissue mobilization    Soft tissue mobilization  Pt in Rt sidelying TPR to Lt hip rotators     Pt shown standing trunk ext for use after prolonged sitting.           PT Short Term Goals - 11/16/18 1651      PT SHORT TERM GOAL #1   Title  Patient able to centralize LLE sx    Time  3    Period  Weeks    Status  New    Target Date  12/07/18        PT Long Term Goals - 11/16/18 1648      PT LONG TERM GOAL #1   Title  Ind with HEP for strengthening and flexibility    Time  6    Period  Weeks    Status  New    Target Date  12/28/18      PT LONG TERM GOAL #2   Title  Patient able to sit without pain and numbness in LLE.    Time  6    Period  Weeks    Status  New      PT LONG TERM GOAL #3   Title  Patient able to walk without pain with a normal gait pattern    Time  6    Period  Weeks    Status  New      PT LONG TERM GOAL #4   Title  Patient able to perform ADLS with pain [id number] or less    Time  6    Period  Weeks    Status  New      PT LONG TERM GOAL #5   Title  Improved LLE strength to 5/5    Time  6    Period  Weeks    Status  New            Plan - 11/23/18 1555    Clinical Impression Statement  Reviewed HEP with pt; included additional exercises. Pt tolerated exercises well with no increased reports of pain. She is making gradual progress toward all goals.    Rehab Potential  Good    PT Frequency  2x / week    PT Duration  6 weeks    PT Treatment/Interventions   ADLs/Self Care Home Management;Cryotherapy;Traction;Therapeutic exercise;Neuromuscular re-education;Patient/family education;Manual techniques;Dry needling;Taping    PT Next Visit Plan  Continue with stretches, lumbar stab, LE strengthening exercise    PT Home Exercise Plan  [phone]       Patient will benefit from skilled therapeutic intervention in order to improve the following deficits and impairments:  Abnormal gait, Impaired sensation, Pain, Impaired flexibility, Decreased activity tolerance, Decreased strength  Visit Diagnosis: Radiculopathy, lumbar region     Problem List Patient Active Problem List   Diagnosis Date Noted  . Facet hypertrophy of lumbar region 10/12/2018  . Cough 06/02/2017  . Rectal bleeding 06/02/2017  . Malignant neoplasm of exocervix ([place]) 03/25/2016  . Neoplasm of female genital organ 10/27/2014  . Abnormal Papanicolaou smear of cervix with positive human papilloma virus (HPV) test 10/27/2014  . Pre-diabetes 06/23/2013  . Polyp of colon, adenomatous 03/19/2013  . Abnormal mammogram 02/28/2013    Gardiner Rhyme, SPTA 11/23/2018, 4:45 PM   Kerin Perna, PTA 11/23/18 5:03 PM   [place], [place], [address] Phone: [phone]   Fax:  [phone]  Name: Haley Francis MRN: [id number] Date of Birth: 1955/04/25

## 2018-11-26 ENCOUNTER — Encounter: Payer: BLUE CROSS/BLUE SHIELD | Admitting: Physical Therapy

## 2018-11-30 ENCOUNTER — Ambulatory Visit (INDEPENDENT_AMBULATORY_CARE_PROVIDER_SITE_OTHER): Payer: BLUE CROSS/BLUE SHIELD | Admitting: Physical Therapy

## 2018-11-30 ENCOUNTER — Encounter: Payer: Self-pay | Admitting: Physical Therapy

## 2018-11-30 ENCOUNTER — Other Ambulatory Visit: Payer: Self-pay

## 2018-11-30 DIAGNOSIS — M5416 Radiculopathy, lumbar region: Secondary | ICD-10-CM | POA: Diagnosis not present

## 2018-11-30 NOTE — Therapy (Signed)
[place] [facility] [place], [place], [address] Phone: [phone]   Fax:  [phone]  Physical Therapy Treatment  Patient Details  Name: Haley Francis MRN: [id number] Date of Birth: 10/17/1955 Referring Provider (PT): Iran Planas   Encounter Date: 11/30/2018  PT End of Session - 11/30/18 1433    Visit Number  3    Date for PT Re-Evaluation  12/28/18    PT Start Time  1433    PT Stop Time  1516    PT Time Calculation (min)  43 min    Activity Tolerance  Patient tolerated treatment well    Behavior During Therapy  [facility] for tasks assessed/performed       History reviewed. No pertinent past medical history.  History reviewed. No pertinent surgical history.  There were no vitals filed for this visit.  Subjective Assessment - 11/30/18 1434    Subjective  Daughter reports that pain is in lower leg on left and she still has numbness.    Patient is accompained by:  Family member    Pertinent History  cervical cancer 2017, allergies    Patient Stated Goals  get rid of pain and numbness    Currently in Pain?  Yes    Pain Score  2     Pain Location  Leg    Pain Orientation  Left    Pain Descriptors / Indicators  Numbness;Sore    Pain Type  Acute pain                       [facility] Adult PT Treatment/Exercise - 11/30/18 0001      Lumbar Exercises: Stretches   Press Ups  10 reps x 5 sec hold     Lumbar Exercises: Seated   Other Seated Lumbar Exercises  left knee flex red band x 20      Lumbar Exercises: Supine   Bridge  10 reps   with ab set   Bridge Limitations then 10 with 5 sec hold on orange theraball      Lumbar Exercises: Sidelying   Clam  Left;10 reps    Clam Limitations  with red band 5 sec hold     Lumbar Exercises: Prone   Straight Leg Raise  20 reps;3 seconds    Other Prone Lumbar Exercises  pelvic press 5x 5 sec      Lumbar Exercises: Quadruped   Straight Leg Raise  20 reps;3  seconds      Manual Therapy   Manual Therapy  Soft tissue mobilization    Manual therapy comments  skilled palpation and monitoring of soft tissue during DN     Soft tissue mobilization  to right pirformis and gluteals       Trigger Point Dry Needling - 11/30/18 0001    Consent Given?  Yes    Education Handout Provided  Yes    Muscles Treated Back/Hip  Gluteus minimus;Gluteus medius;Piriformis    Gluteus Minimus Response  Twitch response elicited;Palpable increased muscle length    Gluteus Medius Response  Palpable increased muscle length    Piriformis Response  Twitch response elicited;Palpable increased muscle length           PT Education - 11/30/18 1642    Education Details  DN education and aftercare    Person(s) Educated  Patient;Child(ren)    Methods  Explanation    Comprehension  Verbalized understanding       PT Short Term  Goals - 11/16/18 1651      PT SHORT TERM GOAL #1   Title  Patient able to centralize LLE sx    Time  3    Period  Weeks    Status  New    Target Date  12/07/18        PT Long Term Goals - 11/30/18 1646      PT LONG TERM GOAL #1   Title  Ind with HEP for strengthening and flexibility    Time  6    Period  Weeks    Status  On-going      PT LONG TERM GOAL #2   Title  Patient able to sit without pain and numbness in LLE.    Status  On-going      PT LONG TERM GOAL #3   Title  Patient able to walk without pain with a normal gait pattern    Status  On-going      PT LONG TERM GOAL #4   Title  Patient able to perform ADLS with pain [id number] or less    Status  On-going      PT LONG TERM GOAL #5   Title  Improved LLE strength to 5/5    Status  On-going            Plan - 11/30/18 1642    Clinical Impression Statement  Patient is doing very well with exercises and tolerated advancement without pain. She has TPs in right piriformis and gluteals and had good response to DN in these areas.    Comorbidities  cervical cancer 2018     PT Treatment/Interventions  ADLs/Self Care Home Management;Cryotherapy;Traction;Therapeutic exercise;Neuromuscular re-education;Patient/family education;Manual techniques;Dry needling;Taping    PT Next Visit Plan  Assess DN, Continue with lumbar stab, LE strengthening exercise. If progress HEP eliminate lower level TE.    PT Home Exercise Plan  [phone]       Patient will benefit from skilled therapeutic intervention in order to improve the following deficits and impairments:  Abnormal gait, Impaired sensation, Pain, Impaired flexibility, Decreased activity tolerance, Decreased strength  Visit Diagnosis: Radiculopathy, lumbar region     Problem List Patient Active Problem List   Diagnosis Date Noted  . Facet hypertrophy of lumbar region 10/12/2018  . Cough 06/02/2017  . Rectal bleeding 06/02/2017  . Malignant neoplasm of exocervix ([place]) 03/25/2016  . Neoplasm of female genital organ 10/27/2014  . Abnormal Papanicolaou smear of cervix with positive human papilloma virus (HPV) test 10/27/2014  . Pre-diabetes 06/23/2013  . Polyp of colon, adenomatous 03/19/2013  . Abnormal mammogram 02/28/2013    Madelyn Flavors PT 11/30/2018, 4:50 PM  [facility] [place], [place], [address] Phone: [phone]   Fax:  [phone]  Name: Haley Francis MRN: [id number] Date of Birth: 1955/12/06

## 2018-12-03 ENCOUNTER — Encounter: Payer: Self-pay | Admitting: Physical Therapy

## 2018-12-03 ENCOUNTER — Ambulatory Visit (INDEPENDENT_AMBULATORY_CARE_PROVIDER_SITE_OTHER): Payer: BLUE CROSS/BLUE SHIELD | Admitting: Physical Therapy

## 2018-12-03 ENCOUNTER — Other Ambulatory Visit: Payer: Self-pay

## 2018-12-03 DIAGNOSIS — M5416 Radiculopathy, lumbar region: Secondary | ICD-10-CM | POA: Diagnosis not present

## 2018-12-03 NOTE — Therapy (Signed)
[place], [place], [address] Phone: [phone]   Fax:  [phone]  Physical Therapy Treatment  Patient Details  Name: Haley Francis MRN: [id number] Date of Birth: 1955-06-29 Referring Provider (PT): Iran Planas   Encounter Date: 12/03/2018  PT End of Session - 12/03/18 1442    Visit Number  4    Date for PT Re-Evaluation  12/28/18    PT Start Time  1442    PT Stop Time  1528    PT Time Calculation (min)  46 min    Activity Tolerance  Patient tolerated treatment well    Behavior During Therapy  [facility] for tasks assessed/performed       History reviewed. No pertinent past medical history.  History reviewed. No pertinent surgical history.  There were no vitals filed for this visit.  Subjective Assessment - 12/03/18 1444    Subjective  Pt states that she still has numbness and tingling in her left leg down into her foot. Pt states that the radicular symptoms have decreased in intenstiy since starting therapy. (Daughter present, as Museum/gallery curator)   Currently in Pain?  No/denies         [facility] PT Assessment - 12/03/18 0001      Assessment   Medical Diagnosis  left low back pain with sciatica    Referring Provider (PT)  Iran Planas    Onset Date/Surgical Date  10/12/18    Next MD Visit  PRN    Prior Therapy  no       [facility] Adult PT Treatment/Exercise - 12/03/18 0001      Lumbar Exercises: Stretches   Passive Hamstring Stretch  Left;2 reps;20 seconds (increased numbness in foot)   Piriformis Stretch  Right;Left;2 reps;20 seconds   supine     Lumbar Exercises: Aerobic   Nustep  L5 x 46min (arms/legs)       Lumbar Exercises: Standing   Wall Slides  15 reps;5 seconds      Lumbar Exercises: Seated   Long Arc Quad on Chair  Strengthening;2 sets;10 reps (4#)   Other Seated Lumbar Exercises  Ham curls x 20 reps; green band (increased radicular symptoms)     Lumbar Exercises: Supine    Other Supine Lumbar Exercises  Lt LE neural flossing to reduce numbness in toes    Other Supine Lumbar Exercises  thoracic extensiion on pool noodle for 30 sec       Lumbar Exercises: Prone   Other Prone Lumbar Exercises  press ups x 10 5 sec hold    Other Prone Lumbar Exercises  opposite arm/leg , Rt/Lt x 10 reps      Lumbar Exercises: Quadruped   [place]  10 reps    Opposite Arm/Leg Raise  --      Manual Therapy   Manual Therapy  Neural Stretch    Neural Stretch  Slides and glides with LLE, (hip flexion, knee ext, DF)- 3 reps (no changes in symptoms)              PT Short Term Goals - 11/16/18 1651      PT SHORT TERM GOAL #1   Title  Patient able to centralize LLE sx    Time  3    Period  Weeks    Status  New    Target Date  12/07/18        PT Long Term Goals - 11/30/18 1646      PT  LONG TERM GOAL #1   Title  Ind with HEP for strengthening and flexibility    Time  6    Period  Weeks    Status  On-going      PT LONG TERM GOAL #2   Title  Patient able to sit without pain and numbness in LLE.    Status  On-going      PT LONG TERM GOAL #3   Title  Patient able to walk without pain with a normal gait pattern    Status  On-going      PT LONG TERM GOAL #4   Title  Patient able to perform ADLS with pain [id number] or less    Status  On-going      PT LONG TERM GOAL #5   Title  Improved LLE strength to 5/5    Status  On-going            Plan - 12/03/18 1657    Clinical Impression Statement  Pt reported radicular symptoms in Lt LE throughout treatment with numbness in toes; reported limited relief with stretching. Discussed traction with pt and pt's daughter and the benefits of reducing radicular symptoms.Pt is making progress toward LTG # 2 and 4; will continue to benefit from skilled PT for improved functional mobility. Per daughter, pt interested in reducing freq due to improvement of intensity of symptoms.    Comorbidities  cervical cancer 2018     Rehab Potential  Good    PT Frequency  2x / week    PT Duration  6 weeks    PT Treatment/Interventions  ADLs/Self Care Home Management;Cryotherapy;Traction;Therapeutic exercise;Neuromuscular re-education;Patient/family education;Manual techniques;Dry needling;Taping    PT Next Visit Plan  Trial of traction;assess DN, continue with lumbar stab, LE strengthening exercise. If progress HEP eliminate lower level TE.    PT Home Exercise Plan  [phone]       Patient will benefit from skilled therapeutic intervention in order to improve the following deficits and impairments:  Abnormal gait, Impaired sensation, Pain, Impaired flexibility, Decreased activity tolerance, Decreased strength  Visit Diagnosis: Radiculopathy, lumbar region     Problem List Patient Active Problem List   Diagnosis Date Noted  . Facet hypertrophy of lumbar region 10/12/2018  . Cough 06/02/2017  . Rectal bleeding 06/02/2017  . Malignant neoplasm of exocervix ([place]) 03/25/2016  . Neoplasm of female genital organ 10/27/2014  . Abnormal Papanicolaou smear of cervix with positive human papilloma virus (HPV) test 10/27/2014  . Pre-diabetes 06/23/2013  . Polyp of colon, adenomatous 03/19/2013  . Abnormal mammogram 02/28/2013    Gardiner Rhyme, SPTA 12/03/2018, 5:18 PM   Kerin Perna, PTA 12/03/18 6:29 PM   [place], [place], [address] Phone: [phone]   Fax:  [phone]  Name: Mandalyn Moscone MRN: [id number] Date of Birth: 1955/07/09

## 2018-12-07 ENCOUNTER — Ambulatory Visit (INDEPENDENT_AMBULATORY_CARE_PROVIDER_SITE_OTHER): Payer: BLUE CROSS/BLUE SHIELD | Admitting: Physical Therapy

## 2018-12-07 ENCOUNTER — Encounter: Payer: Self-pay | Admitting: Physical Therapy

## 2018-12-07 ENCOUNTER — Other Ambulatory Visit: Payer: Self-pay

## 2018-12-07 DIAGNOSIS — M5416 Radiculopathy, lumbar region: Secondary | ICD-10-CM | POA: Diagnosis not present

## 2018-12-07 NOTE — Therapy (Signed)
[place], [place], [address] Phone: [phone]   Fax:  [phone]  Physical Therapy Treatment  Patient Details  Name: Haley Francis MRN: [id number] Date of Birth: 1955/11/13 Referring Provider (PT): Iran Planas   Encounter Date: 12/07/2018  PT End of Session - 12/07/18 1517    Visit Number  5    Date for PT Re-Evaluation  12/28/18    PT Start Time  1435    PT Stop Time  1523    PT Time Calculation (min)  48 min    Activity Tolerance  Patient tolerated treatment well    Behavior During Therapy  [facility] for tasks assessed/performed       History reviewed. No pertinent past medical history.  History reviewed. No pertinent surgical history.  There were no vitals filed for this visit.  Subjective Assessment - 12/07/18 1441    Currently in Pain?  Yes    Pain Score  2     Pain Location  Leg    Pain Orientation  Left    Pain Descriptors / Indicators  Numbness;Sore    Pain Type  Acute pain                       [facility] Adult PT Treatment/Exercise - 12/07/18 0001      Self-Care   Self-Care  Other Self-Care Comments    Other Self-Care Comments   discussion of traction reviewed with pt and daughter as pt reluctant to try.      Lumbar Exercises: Sidelying   Other Sidelying Lumbar Exercises  over lg noodle on left side with post rotation of trunk    no change in sx     Lumbar Exercises: Prone   Other Prone Lumbar Exercises  press ups x 10 5 sec hold      Modalities   Modalities  Traction      Traction   Type of Traction  Lumbar    Min (lbs)  15    Max (lbs)  25    Hold Time  60    Rest Time  20    Time  15      Manual Therapy   Manual Therapy  Manual Traction;Myofascial release    Myofascial Release  to right hip rotators in prone with passive hip rotation    Manual Traction  long leg traction x 2 to LLE; lumbar traction 5x 30 sec                PT Short Term  Goals - 12/07/18 1639      PT SHORT TERM GOAL #1   Title  Patient able to centralize LLE sx    Baseline  able to centralize pain but not numbness    Status  Partially Met        PT Long Term Goals - 12/07/18 1638      PT LONG TERM GOAL #1   Title  Ind with HEP for strengthening and flexibility    Status  On-going      PT LONG TERM GOAL #2   Title  Patient able to sit without pain and numbness in LLE.    Status  On-going      PT LONG TERM GOAL #3   Title  Patient able to walk without pain with a normal gait pattern    Status  Partially Met      PT LONG TERM GOAL #4  Title  Patient able to perform ADLS with pain 7/62 or less    Status  On-going      PT LONG TERM GOAL #5   Title  Improved LLE strength to 5/5    Status  On-going            Plan - 12/07/18 1634    Clinical Impression Statement  Patient presents with continued c/o numbness in LLE. It is worse in her toes. Pain has decreased overall. She was reluctant to try mechanical traction, so we proceeded with manual lumbar traction which gave her some relief with numbness but also produced some pain in left HS area. She agreed to mechanical traction at 25# and responded well. No increase in pain and she stated that she felt better.    PT Frequency  2x / week    PT Duration  6 weeks    PT Treatment/Interventions  ADLs/Self Care Home Management;Cryotherapy;Traction;Therapeutic exercise;Neuromuscular re-education;Patient/family education;Manual techniques;Dry needling;Taping    PT Next Visit Plan  Assess traction and continue with gradual increase in pull as tolerated or try static;  continue with lumbar stab, LE strengthening exercise. If progress HEP eliminate lower level TE.    PT Home Exercise Plan  [phone]       Patient will benefit from skilled therapeutic intervention in order to improve the following deficits and impairments:  Abnormal gait, Impaired sensation, Pain, Impaired flexibility, Decreased activity  tolerance, Decreased strength  Visit Diagnosis: Radiculopathy, lumbar region     Problem List Patient Active Problem List   Diagnosis Date Noted  . Facet hypertrophy of lumbar region 10/12/2018  . Cough 06/02/2017  . Rectal bleeding 06/02/2017  . Malignant neoplasm of exocervix ([place]) 03/25/2016  . Neoplasm of female genital organ 10/27/2014  . Abnormal Papanicolaou smear of cervix with positive human papilloma virus (HPV) test 10/27/2014  . Pre-diabetes 06/23/2013  . Polyp of colon, adenomatous 03/19/2013  . Abnormal mammogram 02/28/2013    Madelyn Flavors PT 12/07/2018, 4:41 PM  [facility] [place], [place], [address] Phone: [phone]   Fax:  [phone]  Name: Haley Francis MRN: [id number] Date of Birth: [birth year]

## 2018-12-14 ENCOUNTER — Encounter: Payer: BLUE CROSS/BLUE SHIELD | Admitting: Physical Therapy

## 2018-12-17 ENCOUNTER — Ambulatory Visit (INDEPENDENT_AMBULATORY_CARE_PROVIDER_SITE_OTHER): Payer: BLUE CROSS/BLUE SHIELD | Admitting: Physical Therapy

## 2018-12-17 ENCOUNTER — Encounter: Payer: BLUE CROSS/BLUE SHIELD | Admitting: Physical Therapy

## 2018-12-17 ENCOUNTER — Other Ambulatory Visit: Payer: Self-pay

## 2018-12-17 ENCOUNTER — Encounter: Payer: Self-pay | Admitting: Physical Therapy

## 2018-12-17 ENCOUNTER — Telehealth: Payer: Self-pay | Admitting: Neurology

## 2018-12-17 DIAGNOSIS — M5416 Radiculopathy, lumbar region: Secondary | ICD-10-CM | POA: Diagnosis not present

## 2018-12-17 NOTE — Telephone Encounter (Signed)
Patient's daughter called to ask for a prescription for cough medication she was given by Dr. Erlene Quan. She had a cough a couple weeks ago, gone now. She wanted to an RX to have on hand in case she needed it. Medication was benzonatate. Please advise.

## 2018-12-17 NOTE — Therapy (Signed)
[place], [place], [address] Phone: [phone]   Fax:  [phone]  Physical Therapy Treatment  Patient Details  Name: Haley Francis MRN: [id number] Date of Birth: Jun 14, 1955 Referring Provider (PT): Iran Planas   Encounter Date: 12/17/2018  PT End of Session - 12/17/18 1456    Visit Number  6    Date for PT Re-Evaluation  12/28/18    PT Start Time  9166 (pt arrived late)   PT Stop Time  1545    PT Time Calculation (min)  52 min    Activity Tolerance  Patient tolerated treatment well    Behavior During Therapy  [facility] for tasks assessed/performed       History reviewed. No pertinent past medical history.  History reviewed. No pertinent surgical history.  There were no vitals filed for this visit.  Subjective Assessment - 12/17/18 1457    Subjective  Pt states that traction helped relieve radicular symptoms that day; returned the next day.    Patient is accompained by:  Family member;Interpreter   Daughter interprets for her   Currently in Pain?  Yes    Pain Score  3     Pain Location  Leg    Pain Orientation  Left    Pain Descriptors / Indicators  Tingling    Pain Type  Acute pain    Aggravating Factors   prolonged sitting    Pain Relieving Factors  ice, resting         [facility] PT Assessment - 12/17/18 0001      Assessment   Medical Diagnosis  left low back pain with sciatica    Referring Provider (PT)  Iran Planas    Onset Date/Surgical Date  10/12/18    Next MD Visit  PRN    Prior Therapy  no            [facility] Adult PT Treatment/Exercise - 12/17/18 0001      Lumbar Exercises: Stretches   Passive Hamstring Stretch  Left;3 reps;20 seconds   supine with strap    Piriformis Stretch  Left;1 rep;30 seconds   supine   Gastroc Stretch  Left;3 reps;20 seconds   both on incline x 2 reps ;1 rep of runners stretch   Gastroc Stretch Limitations  cues for foot position       Lumbar Exercises: Aerobic   Nustep  L5 x 5 min (arms/legs)      Lumbar Exercises: Standing   Wall Slides  --   2 sets of 10 with 5 sec hold   Wall Slides Limitations  cues for foot position and pacing    Other Standing Lumbar Exercises  anti rotation with red band x 5 reps on each side      Traction   Type of Traction  Lumbar    Min (lbs)  20    Max (lbs)  30    Hold Time  60    Rest Time  20    Time  20         PT Short Term Goals - 12/07/18 1639      PT SHORT TERM GOAL #1   Title  Patient able to centralize LLE sx    Baseline  able to centralize pain but not numbness    Status  Partially Met        PT Long Term Goals - 12/07/18 1638      PT LONG TERM GOAL #1  Title  Ind with HEP for strengthening and flexibility    Status  On-going      PT LONG TERM GOAL #2   Title  Patient able to sit without pain and numbness in LLE.    Status  On-going      PT LONG TERM GOAL #3   Title  Patient able to walk without pain with a normal gait pattern    Status  Partially Met      PT LONG TERM GOAL #4   Title  Patient able to perform ADLS with pain 7/03 or less    Status  On-going      PT LONG TERM GOAL #5   Title  Improved LLE strength to 5/5    Status  On-going            Plan - 12/17/18 1656    Clinical Impression Statement  Patient continues to have numbness present in L LE; relieved with mechanical traction. Pt making progress toward STG #1; will continue to benefit from skilled PT for centralization of radicular symptoms and improved functional mobility.    Rehab Potential  Good    PT Frequency  2x / week    PT Duration  6 weeks    PT Treatment/Interventions  ADLs/Self Care Home Management;Cryotherapy;Traction;Therapeutic exercise;Neuromuscular re-education;Patient/family education;Manual techniques;Dry needling;Taping    PT Next Visit Plan  Assess traction and continue with gradual increase in pull as tolerated or try static prn;  continue with lumbar stab, LE  strengthening exercise.    PT Home Exercise Plan  [phone]       Patient will benefit from skilled therapeutic intervention in order to improve the following deficits and impairments:  Abnormal gait, Impaired sensation, Pain, Impaired flexibility, Decreased activity tolerance, Decreased strength  Visit Diagnosis: No diagnosis found.     Problem List Patient Active Problem List   Diagnosis Date Noted  . Facet hypertrophy of lumbar region 10/12/2018  . Cough 06/02/2017  . Rectal bleeding 06/02/2017  . Malignant neoplasm of exocervix ([place]) 03/25/2016  . Neoplasm of female genital organ 10/27/2014  . Abnormal Papanicolaou smear of cervix with positive human papilloma virus (HPV) test 10/27/2014  . Pre-diabetes 06/23/2013  . Polyp of colon, adenomatous 03/19/2013  . Abnormal mammogram 02/28/2013    Gardiner Rhyme, SPTA 12/17/2018, 5:28 PM   Kerin Perna, PTA 12/17/18 6:07 PM   [place], [place], [address] Phone: [phone]   Fax:  [phone]  Name: Haley Francis MRN: [id number] Date of Birth: [birth year]

## 2018-12-18 MED ORDER — BENZONATATE 200 MG PO CAPS: 200.0000 mg | ORAL_CAPSULE | Freq: Two times a day (BID) | ORAL | 0 refills | Status: DC | PRN

## 2018-12-18 NOTE — Telephone Encounter (Signed)
Ok sent!

## 2018-12-24 ENCOUNTER — Encounter: Payer: Self-pay | Admitting: Physical Therapy

## 2018-12-24 ENCOUNTER — Ambulatory Visit (INDEPENDENT_AMBULATORY_CARE_PROVIDER_SITE_OTHER): Payer: BLUE CROSS/BLUE SHIELD | Admitting: Physical Therapy

## 2018-12-24 ENCOUNTER — Other Ambulatory Visit: Payer: Self-pay

## 2018-12-24 DIAGNOSIS — M5416 Radiculopathy, lumbar region: Secondary | ICD-10-CM

## 2018-12-24 NOTE — Therapy (Signed)
[place], [place], [address] Phone: [phone]   Fax:  [phone]  Physical Therapy Treatment  Patient Details  Name: Haley Francis MRN: [id number] Date of Birth: 08/12/1955 Referring Provider (PT): Iran Planas   Encounter Date: 12/24/2018  PT End of Session - 12/24/18 1407    Visit Number  7    Number of Visits  15    Date for PT Re-Evaluation  01/25/19    PT Start Time  8841    PT Stop Time  1449    PT Time Calculation (min)  46 min    Activity Tolerance  Patient tolerated treatment well    Behavior During Therapy  [facility] for tasks assessed/performed       History reviewed. No pertinent past medical history.  History reviewed. No pertinent surgical history.  There were no vitals filed for this visit.  Subjective Assessment - 12/24/18 1407    Subjective  Pt reporting that pain/numbness is in left lateral calf into digits 3-5 dorsally.    Pertinent History  cervical cancer 2017, allergies    Diagnostic tests  xrays    Patient Stated Goals  get rid of pain and numbness    Currently in Pain?  Yes    Pain Score  3     Pain Location  Leg    Pain Orientation  Left    Pain Descriptors / Indicators  Tingling         [facility] PT Assessment - 12/24/18 0001      Strength   Overall Strength Comments  left knee flex 4/5, hip flex 4/5, ext 4+/5      Palpation   Palpation comment  TPs in lateral gastroc, peroneals, lateral HS and gluteals                   [facility] Adult PT Treatment/Exercise - 12/24/18 0001      Self-Care   Other Self-Care Comments   discussion of POC with pt and daughter.      Lumbar Exercises: Stretches   Gastroc Stretch  Right;Left;1 rep;60 seconds      Lumbar Exercises: Aerobic   Nustep  L5 x 5 min (arms/legs)      Lumbar Exercises: Standing   Wall Slides  10 reps;5 seconds      Manual Therapy   Manual Therapy  Soft tissue mobilization    Manual  therapy comments  skilled palpation and monitoring of soft tissue during DN     Soft tissue mobilization  IASTM to left lateral gastroc, peroneals, lateral HS. STM to left gluteals               PT Short Term Goals - 12/07/18 1639      PT SHORT TERM GOAL #1   Title  Patient able to centralize LLE sx    Baseline  able to centralize pain but not numbness    Status  Partially Met        PT Long Term Goals - 12/24/18 1408      PT LONG TERM GOAL #1   Title  Ind with HEP for strengthening and flexibility    Status  Partially Met      PT LONG TERM GOAL #2   Title  Patient able to sit without pain and numbness in LLE.    Baseline  still feeling in left buttock    Status  On-going      PT LONG TERM  GOAL #3   Title  Patient able to walk without pain with a normal gait pattern    Status  Achieved      PT LONG TERM GOAL #4   Title  Patient able to perform ADLS with pain 3/21 or less    Baseline  3/10 average      PT LONG TERM GOAL #5   Title  Improved LLE strength to 5/5    Baseline  hip flex and knee flex have decreased to 4/5    Status  On-going            Plan - 12/24/18 1710    Clinical Impression Statement  Patient presents with continued c/o numbness in her left lateral calf and digits 3-5 dorsally. While she gets relief from traction, the numbness returns. She feels that she is making improvements, but she has actually lost some strength in left hip flexion and knee flexion since eval. She responded very well to DN in the lateral gastroc, peroneals, lateral HS and gluteals. Plan to continue for 4 more weeks unless sx worsen.    Comorbidities  cervical cancer 2018    PT Frequency  2x / week    PT Duration  4 weeks    PT Treatment/Interventions  ADLs/Self Care Home Management;Cryotherapy;Traction;Therapeutic exercise;Neuromuscular re-education;Patient/family education;Manual techniques;Dry needling;Taping    PT Next Visit Plan  assess response to DN and IASTM.  Focus on hip flex, knee flex, lumbar stab.    PT Home Exercise Plan  [email or web address]    Consulted and Agree with Plan of Care  Patient       Patient will benefit from skilled therapeutic intervention in order to improve the following deficits and impairments:  Abnormal gait, Impaired sensation, Pain, Impaired flexibility, Decreased activity tolerance, Decreased strength  Visit Diagnosis: Radiculopathy, lumbar region - Plan: PT plan of care cert/re-cert     Problem List Patient Active Problem List   Diagnosis Date Noted  . Facet hypertrophy of lumbar region 10/12/2018  . Cough 06/02/2017  . Rectal bleeding 06/02/2017  . Malignant neoplasm of exocervix ([place]) 03/25/2016  . Neoplasm of female genital organ 10/27/2014  . Abnormal Papanicolaou smear of cervix with positive human papilloma virus (HPV) test 10/27/2014  . Pre-diabetes 06/23/2013  . Polyp of colon, adenomatous 03/19/2013  . Abnormal mammogram 02/28/2013    Madelyn Flavors PT 12/24/2018, 5:34 PM  [facility] [place], [place], [address] Phone: [phone]   Fax:  [phone]  Name: Haley Francis MRN: [id number] Date of Birth: 03-Sep-1955

## 2018-12-31 ENCOUNTER — Ambulatory Visit: Payer: BLUE CROSS/BLUE SHIELD | Admitting: Rehabilitative and Restorative Service Providers"

## 2018-12-31 ENCOUNTER — Other Ambulatory Visit: Payer: Self-pay

## 2018-12-31 ENCOUNTER — Encounter: Payer: Self-pay | Admitting: Rehabilitative and Restorative Service Providers"

## 2018-12-31 DIAGNOSIS — M5416 Radiculopathy, lumbar region: Secondary | ICD-10-CM | POA: Diagnosis not present

## 2018-12-31 NOTE — Patient Instructions (Signed)
Access Code: [email or web address]  URL: [email or web address]  Date: 12/31/2018  Prepared by: Rudell Cobb   Exercises Seated Hamstring Stretch with Chair - 3 reps - 1 sets - 30 seconds hold - 2x daily - 7x weekly Prone Press Up - 10 reps - 1 sets - 2x daily - 7x weekly Bird Dog - 10 reps - 1 sets - 2x daily - 7x weekly Supine Piriformis Stretch - 3 reps - 1 sets - 20-30 seconds hold - 2x daily - 7x weekly Standing ITB Stretch - 10 reps - 3 sets - 1x daily - 7x weekly

## 2018-12-31 NOTE — Therapy (Signed)
[place], [place], [address] Phone: [phone]   Fax:  [phone]  Physical Therapy Treatment  Patient Details  Name: Haley Francis MRN: [id number] Date of Birth: 03/22/1955 Referring Provider (PT): Iran Planas   Encounter Date: 12/31/2018  PT End of Session - 12/31/18 1622    Visit Number  8    Number of Visits  15    Date for PT Re-Evaluation  01/25/19    PT Start Time  3212    PT Stop Time  1700    PT Time Calculation (min)  45 min    Activity Tolerance  Patient tolerated treatment well    Behavior During Therapy  [facility] for tasks assessed/performed       History reviewed. No pertinent past medical history.  History reviewed. No pertinent surgical history.  There were no vitals filed for this visit.  Subjective Assessment - 12/31/18 1620    Subjective  The patient reports no pain, numbness in L lateral calf and 3 lateral toes on dorsal aspect.    Patient is accompained by:  Family member;Interpreter   daughter interprets for her   Pertinent History  cervical cancer 2017, allergies    Diagnostic tests  xrays    Patient Stated Goals  get rid of pain and numbness    Currently in Pain?  Yes    Pain Score  0-No pain    Pain Location  Leg    Pain Orientation  Left    Pain Descriptors / Indicators  Numbness    Pain Type  Acute pain    Pain Onset  More than a month ago    Pain Frequency  Constant    Aggravating Factors   prolonged sitting    Pain Relieving Factors  ice, rest                       [facility] Adult PT Treatment/Exercise - 12/31/18 1630      Exercises   Exercises  Lumbar      Lumbar Exercises: Stretches   Active Hamstring Stretch  2 reps;30 seconds;Right;Left    Press Ups  10 reps    Press Ups Limitations  transitioned from quadriped to prone pressup position    ITB Stretch  Left;2 reps    Piriformis Stretch  Right;Left;2 reps;30 seconds    Other Lumbar  Stretch Exercise  Standing modified down dog with UEs on mat table pulling hips posteriorly while reaching arms anteriorly.  Standing hip flexor and lateral trunk stretch in lunge position R and L sides with multiple cues for technique.    Other Lumbar Stretch Exercise  Standing wiht UEs supported on mat rotating to reach R UE towards ceiling, return to midline and then L UE for trunk rotation.       Lumbar Exercises: Aerobic   Nustep  L5 x 5 minutes (arms and legs)      Lumbar Exercises: Standing   Other Standing Lumbar Exercises  Standing marching during gait activities without pain or weakness notes, performed heel walking x 30 feet, toe walking x 30 feet.      Lumbar Exercises: Supine   Dead Bug  10 reps    Bridge  10 reps    Other Supine Lumbar Exercises  isometric self resisted hip flexion bilaterally x 5 reps      Lumbar Exercises: Quadruped   Opposite Arm/Leg Raise  10 reps    Opposite Arm/Leg  Raise Limitations  *added to HEP.    Other Quadruped Lumbar Exercises  Lumbar rocking anteriorly/posteriorly      Manual Therapy   Manual Therapy  Soft tissue mobilization    Soft tissue mobilization  STM including TPR to L lateral hamstring distal to proximal             PT Education - 12/31/18 1803    Education Details  Updated HEP provided    Person(s) Educated  Patient    Methods  Demonstration;Explanation;Handout    Comprehension  Verbalized understanding;Returned demonstration       PT Short Term Goals - 12/07/18 1639      PT SHORT TERM GOAL #1   Title  Patient able to centralize LLE sx    Baseline  able to centralize pain but not numbness    Status  Partially Met        PT Long Term Goals - 12/24/18 1408      PT LONG TERM GOAL #1   Title  Ind with HEP for strengthening and flexibility    Status  Partially Met      PT LONG TERM GOAL #2   Title  Patient able to sit without pain and numbness in LLE.    Baseline  still feeling in left buttock    Status   On-going      PT LONG TERM GOAL #3   Title  Patient able to walk without pain with a normal gait pattern    Status  Achieved      PT LONG TERM GOAL #4   Title  Patient able to perform ADLS with pain 0/27 or less    Baseline  3/10 average      PT LONG TERM GOAL #5   Title  Improved LLE strength to 5/5    Baseline  hip flex and knee flex have decreased to 4/5    Status  On-going            Plan - 12/31/18 1817    Clinical Impression Statement  The patient is no longer experiencing pain, but is continuing with L distal lateral numbness and lateral toe numbness (3 digits).  PT updated HEP today and performed STM for tightness in L LE.  The patient notes return to functional activities at home without significant pain.  Continue working to [employer].    PT Treatment/Interventions  ADLs/Self Care Home Management;Cryotherapy;Traction;Therapeutic exercise;Neuromuscular re-education;Patient/family education;Manual techniques;Dry needling;Taping    PT Next Visit Plan  assess response to DN and IASTM. Focus on hip flex, knee flex, lumbar stab.    PT Home Exercise Plan  [email or web address]    Consulted and Agree with Plan of Care  Patient       Patient will benefit from skilled therapeutic intervention in order to improve the following deficits and impairments:  Abnormal gait, Impaired sensation, Pain, Impaired flexibility, Decreased activity tolerance, Decreased strength  Visit Diagnosis: Radiculopathy, lumbar region     Problem List Patient Active Problem List   Diagnosis Date Noted  . Facet hypertrophy of lumbar region 10/12/2018  . Cough 06/02/2017  . Rectal bleeding 06/02/2017  . Malignant neoplasm of exocervix ([place]) 03/25/2016  . Neoplasm of female genital organ 10/27/2014  . Abnormal Papanicolaou smear of cervix with positive human papilloma virus (HPV) test 10/27/2014  . Pre-diabetes 06/23/2013  . Polyp of colon, adenomatous 03/19/2013  . Abnormal mammogram 02/28/2013     Stephany Poorman, PT 12/31/2018, 6:19 PM  [address]  [place], [place], [address] Phone: [phone]   Fax:  [phone]  Name: Erynn Vaca MRN: [id number] Date of Birth: 04-06-1955

## 2019-01-04 ENCOUNTER — Ambulatory Visit (INDEPENDENT_AMBULATORY_CARE_PROVIDER_SITE_OTHER): Payer: BLUE CROSS/BLUE SHIELD | Admitting: Physical Therapy

## 2019-01-04 ENCOUNTER — Other Ambulatory Visit: Payer: Self-pay

## 2019-01-04 DIAGNOSIS — M5416 Radiculopathy, lumbar region: Secondary | ICD-10-CM | POA: Diagnosis not present

## 2019-01-04 NOTE — Therapy (Signed)
[place], [place], [address] Phone: [phone]   Fax:  [phone]  Physical Therapy Treatment  Patient Details  Name: Haley Francis MRN: [id number] Date of Birth: January 19, 1955 Referring Provider (PT): Iran Planas   Encounter Date: 01/04/2019  PT End of Session - 01/04/19 1511    Visit Number  9    Number of Visits  15    Date for PT Re-Evaluation  01/25/19    PT Start Time  7858    PT Stop Time  1513    PT Time Calculation (min)  39 min    Activity Tolerance  Patient tolerated treatment well    Behavior During Therapy  [facility] for tasks assessed/performed       No past medical history on file.  No past surgical history on file.  There were no vitals filed for this visit.  Subjective Assessment - 01/04/19 1440    Subjective  Pt reports the numbness is now only in 1 toe. She feels like the DN was very helpful. She is doing exercises 2x/day.    Patient is accompained by:  Family member;Interpreter   daughter interprets   Patient Stated Goals  get rid of pain and numbness    Currently in Pain?  No/denies    Pain Score  0-No pain         [facility] PT Assessment - 01/04/19 0001      Assessment   Medical Diagnosis  left low back pain with sciatica    Referring Provider (PT)  Iran Planas    Onset Date/Surgical Date  10/12/18    Next MD Visit  PRN    Prior Therapy  no      Strength   Overall Strength Comments  left knee flex 4/5, hip flex 5/5, ext 5/5        [facility] Adult PT Treatment/Exercise - 01/04/19 0001      Self-Care   Other Self-Care Comments   Pt educated on self massage with ball to bottom of foot and calf; pt returned demo with cues.       Lumbar Exercises: Stretches   Press Ups  5 seconds;10 reps    Piriformis Stretch  Right;Left;2 reps;30 seconds      Lumbar Exercises: Aerobic   Nustep  L5: 5 min (arms _0 )       Lumbar Exercises: Prone   Opposite Arm/Leg Raise  Right  arm/Left leg;Left arm/Right leg;10 reps;2 seconds      Lumbar Exercises: Quadruped   Straight Leg Raise  10 reps    Opposite Arm/Leg Raise  Left arm/Right leg;Right arm/Left leg;5 reps    Opposite Arm/Leg Raise Limitations  difficulty with balance       Manual Therapy   Manual Therapy  Passive ROM;Soft tissue mobilization    Manual therapy comments  pt in prone    Soft tissue mobilization  STM to Lt glute, piriformis, lateral hamstring, lateral gastroc and soleus, plantar fascia and into 5th toe on LLE.   TPR and cross fiber friction to Lt lateral distal hamstring.     Passive ROM  Lt ankle into DF with knee flexion                PT Short Term Goals - 12/07/18 1639      PT SHORT TERM GOAL #1   Title  Patient able to centralize LLE sx    Baseline  able to centralize pain but not numbness  Status  Partially Met        PT Long Term Goals - 01/04/19 1453      PT LONG TERM GOAL #1   Title  Ind with HEP for strengthening and flexibility    Status  Partially Met      PT LONG TERM GOAL #2   Title  Patient able to sit without pain and numbness in LLE.    Baseline  numbness in 5th toe on Lt foot    Status  Partially Met      PT LONG TERM GOAL #3   Title  Patient able to walk without pain with a normal gait pattern    Status  Achieved      PT LONG TERM GOAL #4   Title  Patient able to perform ADLS with pain 7/90 or less    Baseline  3/10 average    Status  Achieved      PT LONG TERM GOAL #5   Title  Improved LLE strength to 5/5    Baseline  --    Status  Partially Met            Plan - 01/04/19 1639    Clinical Impression Statement  Pt had difficulty with balance during opp arm/leg in quadruped, but tolerated prone position well.  Pt now only having numbness in Lt 5th toe. tightness noted along Lt lateral posterior chain of LE.  After manual therapy today, toe was no longer numb. Pt interested in an additional visit for DN then may consider hold or d/c.  Pt  has met LTG 4 and partially met LTG 2 and 5.    PT Treatment/Interventions  ADLs/Self Care Home Management;Cryotherapy;Traction;Therapeutic exercise;Neuromuscular re-education;Patient/family education;Manual techniques;Dry needling;Taping    PT Next Visit Plan  assess response to DN and IASTM. Focus on hip flex, knee flex, lumbar stab.    PT Home Exercise Plan  [email or web address]    Consulted and Agree with Plan of Care  Patient       Patient will benefit from skilled therapeutic intervention in order to improve the following deficits and impairments:  Abnormal gait, Impaired sensation, Pain, Impaired flexibility, Decreased activity tolerance, Decreased strength  Visit Diagnosis: Radiculopathy, lumbar region     Problem List Patient Active Problem List   Diagnosis Date Noted  . Facet hypertrophy of lumbar region 10/12/2018  . Cough 06/02/2017  . Rectal bleeding 06/02/2017  . Malignant neoplasm of exocervix ([place]) 03/25/2016  . Neoplasm of female genital organ 10/27/2014  . Abnormal Papanicolaou smear of cervix with positive human papilloma virus (HPV) test 10/27/2014  . Pre-diabetes 06/23/2013  . Polyp of colon, adenomatous 03/19/2013  . Abnormal mammogram 02/28/2013   Kerin Perna, PTA 01/04/19 4:44 PM  [place], [place], [address] Phone: [phone]   Fax:  [phone]  Name: Haley Francis MRN: [id number] Date of Birth: March 07, 1955

## 2019-01-04 NOTE — Patient Instructions (Signed)
Arm / Leg Lift: Opposite (Prone)    Lift right leg and opposite arm __3-4__ inches from floor, keeping knee locked. Repeat __10__ times per set. Do _1___ sets per session. Do __1__ sessions per day.

## 2019-01-19 ENCOUNTER — Other Ambulatory Visit: Payer: Self-pay

## 2019-01-19 ENCOUNTER — Ambulatory Visit (INDEPENDENT_AMBULATORY_CARE_PROVIDER_SITE_OTHER): Payer: BLUE CROSS/BLUE SHIELD | Admitting: Physical Therapy

## 2019-01-19 ENCOUNTER — Telehealth: Payer: Self-pay | Admitting: Neurology

## 2019-01-19 ENCOUNTER — Encounter: Payer: Self-pay | Admitting: Physical Therapy

## 2019-01-19 DIAGNOSIS — M5416 Radiculopathy, lumbar region: Secondary | ICD-10-CM

## 2019-01-19 NOTE — Patient Instructions (Addendum)
RE-ALIGNMENT ROUTINE EXERCISES-OSTEOPROROSIS BASIC FOR POSTURAL CORRECTION   RE-ALIGNMENT Tips BENEFITS: 1.It helps to re-align the curves of the back and improve standing posture. 2.It allows the back muscles to rest and strengthen in preparation for more activity. FREQUENCY: Daily, even after weeks, months and years of more advanced exercises. START: 1.All exercises start in the same position: lying on the back, arms resting on the supporting surface, palms up and slightly away from the body, backs of hands down, knees bent, feet flat. 2.The head, neck, arms, and legs are supported according to specific instructions of your therapist. Copyright  VHI. All rights reserved.    1. Decompression Exercise: Basic.   Takes compression off the vertebral bodies; increases tolerance for lying on the back; helps relieve back pain   Lie on back on firm surface, knees bent, feet flat, arms turned up, out to sides (~35 degrees). Head neck and arms supported as necessary. Time _5-15__ minutes. Surface: floor     2. Shoulder Press  Strengthens upper back extensors and scapular retractors.   Press both shoulders down. Hold _2-3__ seconds. Repeat _3-5__ times. Surface: floor        3. Head Press With [place]  Strengthens neck extensors   Tuck chin SLIGHTLY toward chest, keep mouth closed. Feel weight on back of head. Increase weight by pressing head down. Hold _2-3__ seconds. Relax. Repeat 3-5___ times. Surface: floor     4. Leg Lengthener: stretches quadratus lumborum and hip flexors.  Strengthens quads and ankle dorsiflexors.  Leg Lengthener: Full    Straighten one leg. Pull toes AND forefoot toward knee, extend heel. Lengthen leg by pulling pelvis away from ribs. Hold __5_ seconds. Relax. Repeat 1 time. Re-bend knee. Do other leg. Each leg __5_ times. Surface: floor    Leg Lengthener / Leg Press Combo: Single Leg    Straighten one leg down to floor. Pull toes AND forefoot  toward knee; extend heel. Lengthen leg by pulling pelvis away from ribs. Press leg down. DO NOT BEND KNEE. Hold _5__ seconds. Relax leg. Repeat exercise 1 time. Relax leg. Re-bend knee. Repeat with other leg. Do 5 times   Madelyn Flavors, PT 01/19/19 12:09 PM;  [facility] [place] [place], [place] [address]  [phone] (office) [phone] (fax)

## 2019-01-19 NOTE — Telephone Encounter (Signed)
Patient's daughter called and states her insurance states we are out of network with insurance. She has a lot of questions about this that I can not answer. Please call to discuss with Janett Billow [phone].

## 2019-01-19 NOTE — Therapy (Signed)
[place], [place], [address] Phone: [phone]   Fax:  [phone]  Physical Therapy Treatment  Patient Details  Name: Haley Francis MRN: [id number] Date of Birth: [birth year] Referring Provider (PT): Iran Planas   Encounter Date: 01/19/2019  PT End of Session - 01/19/19 1151    Visit Number  10    Number of Visits  15    Date for PT Re-Evaluation  01/25/19    PT Start Time  1151    PT Stop Time  1235    PT Time Calculation (min)  44 min    Activity Tolerance  Patient tolerated treatment well    Behavior During Therapy  [facility] for tasks assessed/performed       History reviewed. No pertinent past medical history.  History reviewed. No pertinent surgical history.  There were no vitals filed for this visit.  Subjective Assessment - 01/19/19 1151    Subjective  Pt reports the numbness is now only in 1 toe. She feels like the DN was very helpful. She had relief for 1-2 weeks. Prolonged sitting and standing are the biggest irritants. 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 She denies bending and lifting.    Patient is accompained by:  Family member    Patient Stated Goals  get rid of pain and numbness    Currently in Pain?  Yes    Pain Score  3     Pain Location  Leg    Pain Orientation  Left    Pain Descriptors / Indicators  Aching    Pain Type  Acute pain    Pain Radiating Towards  to ankle and numbness in digit 5                       [facility] Adult PT Treatment/Exercise - 01/19/19 0001      Lumbar Exercises: Supine   Other Supine Lumbar Exercises  decompression exercises in hooklying (see pt instructions)      Manual Therapy   Manual Therapy  Soft tissue mobilization    Manual therapy comments  skilled palpation and monitoring of soft tissue during DN      Soft tissue mobilization  to left gastroc, gluteals,piriformis             PT  Education - 01/19/19 1208    Education Details  updated HEP    Person(s) Educated  Patient;Child(ren)    Methods  Explanation;Demonstration;Handout    Comprehension  Verbalized understanding;Returned demonstration       PT Short Term Goals - 12/07/18 1639      PT SHORT TERM GOAL #1   Title  Patient able to centralize LLE sx    Baseline  able to centralize pain but not numbness    Status  Partially Met        PT Long Term Goals - 01/19/19 1247      PT LONG TERM GOAL #1   Title  Ind with HEP for strengthening and flexibility    Status  Achieved      PT LONG TERM GOAL #2   Title  Patient able to sit without pain and numbness in LLE.    Status  Partially Met      PT LONG TERM GOAL #3   Title  Patient able to walk without pain with a normal gait pattern    Status  Achieved  PT LONG TERM GOAL #4   Title  Patient able to perform ADLS with pain 0/01 or less    Status  Achieved      PT LONG TERM GOAL #5   Title  Improved LLE strength to 5/5    Status  Partially Met            Plan - 01/19/19 1240    Clinical Impression Statement  Patient presents today with return of pain in LLE and numbness in digit 5, however she reports relief from DN/STW for 1-2 weeks. She had TPs in left lateral gastroc today and piriformis and responded well to DN and IASTM. Decompression exercises were issued and PT recommended that pt lie down for 5 min every 2 hours to decompress spine during the day.    PT Treatment/Interventions  ADLs/Self Care Home Management;Cryotherapy;Traction;Therapeutic exercise;Neuromuscular re-education;Patient/family education;Manual techniques;Dry needling;Taping    PT Next Visit Plan  Pt may return for one more visit, then d/c or refer back to MD if sx continue. Review body mechanics if returns.    PT Home Exercise Plan  [id number]    Consulted and Agree with Plan of Care  Patient;Family member/caregiver       Patient will benefit from skilled therapeutic  intervention in order to improve the following deficits and impairments:  Abnormal gait, Impaired sensation, Pain, Impaired flexibility, Decreased activity tolerance, Decreased strength  Visit Diagnosis: Radiculopathy, lumbar region     Problem List Patient Active Problem List   Diagnosis Date Noted  . Facet hypertrophy of lumbar region 10/12/2018  . Cough 06/02/2017  . Rectal bleeding 06/02/2017  . Malignant neoplasm of exocervix ([place]) 03/25/2016  . Neoplasm of female genital organ 10/27/2014  . Abnormal Papanicolaou smear of cervix with positive human papilloma virus (HPV) test 10/27/2014  . Pre-diabetes 06/23/2013  . Polyp of colon, adenomatous 03/19/2013  . Abnormal mammogram 02/28/2013    Madelyn Flavors PT 01/19/2019, 12:54 PM  [facility] [place], [place], [address] Phone: [phone]   Fax:  [phone]  Name: Haley Francis MRN: [id number] Date of Birth: 10-30-1955

## 2019-01-25 ENCOUNTER — Encounter: Payer: Self-pay | Admitting: Physical Therapy

## 2019-01-25 ENCOUNTER — Ambulatory Visit (INDEPENDENT_AMBULATORY_CARE_PROVIDER_SITE_OTHER): Payer: BLUE CROSS/BLUE SHIELD | Admitting: Physical Therapy

## 2019-01-25 ENCOUNTER — Other Ambulatory Visit: Payer: Self-pay

## 2019-01-25 DIAGNOSIS — M5416 Radiculopathy, lumbar region: Secondary | ICD-10-CM

## 2019-01-25 NOTE — Therapy (Addendum)
[facility]  [place], [place], [address] Phone: [phone]   Fax:  [phone]  Physical Therapy Treatment and Discharge Summary  Patient Details  Name: Haley Francis MRN: [id number] Date of Birth: [birth year] Referring Provider (PT): Iran Planas   Encounter Date: 01/25/2019  PT End of Session - 01/25/19 1351    Visit Number  11    Number of Visits  15    Date for PT Re-Evaluation  01/25/19    PT Start Time  8251    PT Stop Time  1436    PT Time Calculation (min)  44 min    Activity Tolerance  Patient tolerated treatment well    Behavior During Therapy  [facility] for tasks assessed/performed       History reviewed. No pertinent past medical history.  History reviewed. No pertinent surgical history.  There were no vitals filed for this visit.  Subjective Assessment - 01/25/19 1439    Subjective  Patient reports no pain today, just slight numbness in left digit 5.    Patient is accompained by:  Family member   daughter interprets prn   Pertinent History  cervical cancer 2017, allergies    Patient Stated Goals  get rid of pain and numbness    Currently in Pain?  No/denies                       [facility] Adult PT Treatment/Exercise - 01/25/19 0001      Self-Care   Self-Care  Lifting;ADL's    ADL's  modifications to avoid bending and rounding back    Lifting  15# box x 2, then using lightweight bolster. Used functional squat to mat table x 10 with tactile cues to get correct form.; golfer's lift; reviewed wall sits      Manual Therapy   Manual Therapy  Soft tissue mobilization    Manual therapy comments  skilled palpation and monitoring of soft tissue during DN      Soft tissue mobilization  IASTM to piriformis and hip rotators, proximal HS and lateral gastroc/soleus       Trigger Point Dry Needling - 01/25/19 0001    Consent Given?  Yes    Education Handout Provided  Previously provided     Muscles Treated Lower Quadrant  Hamstring;Gastrocnemius;Soleus    Muscles Treated Back/Hip  Piriformis;Obturator internus    Dry Needling Comments  left    Hamstring Response  Twitch response elicited;Palpable increased muscle length   ++   Gastrocnemius Response  Twitch response elicited;Palpable increased muscle length   lateral   Soleus Response  Twitch response elicited;Palpable increased muscle length   lateral   Piriformis Response  Twitch response elicited    Obturator internus Response  Twitch response elicited           PT Education - 01/25/19 1439    Education Details  Body mechanics and lifting    Person(s) Educated  Patient;Child(ren)    Methods  Explanation;Demonstration;Verbal cues;Tactile cues    Comprehension  Verbalized understanding;Returned demonstration;Tactile cues required       PT Short Term Goals - 12/07/18 1639      PT SHORT TERM GOAL #1   Title  Patient able to centralize LLE sx    Baseline  able to centralize pain but not numbness    Status  Partially Met        PT Long Term Goals - 01/25/19 1409  PT LONG TERM GOAL #1   Title  Ind with HEP for strengthening and flexibility    Status  Achieved      PT LONG TERM GOAL #2   Title  Patient able to sit without pain and numbness in LLE.    Baseline  10-20 min    Time  6    Status  Partially Met      PT LONG TERM GOAL #3   Title  Patient able to walk without pain with a normal gait pattern    Status  Achieved      PT LONG TERM GOAL #4   Title  Patient able to perform ADLS with pain 4/28 or less    Status  Achieved      PT LONG TERM GOAL #5   Title  Improved LLE strength to 5/5    Baseline  hip flex and knee flex 4+/5    Status  Partially Met            Plan - 01/25/19 1441    Clinical Impression Statement  Patient has returned to having no pain after flare up last week. We went over lifting and body mechanics and pt reminded to avoid rounding her spine when lifting and use  her legs. She was able to demonstrate proper mechanics with VCs and TCs. Patient requested DN to try to get last of numbness to stop. She had ++ localized twitch response in left proximal HS. Most of her pain with palpation in the buttocks is tendonous vs. muscular at and around ischial tuberosity. She still showed some signs of tissue adhesions in these same areas with IASTM. Patient to be d/c due to being pleased with current level of fuction, but may benefit from intermittent DN if sx continue to fluctuate.    Comorbidities  cervical cancer 2018    Examination-Activity Limitations  Lift;Sit;Bend;Stand    PT Frequency  2x / week    PT Duration  4 weeks    PT Treatment/Interventions  ADLs/Self Care Home Management;Cryotherapy;Traction;Therapeutic exercise;Neuromuscular re-education;Patient/family education;Manual techniques;Dry needling;Taping    PT Next Visit Plan  hold for 2 weeks, then d/c.    PT Home Exercise Plan  [id number]    Consulted and Agree with Plan of Care  Patient;Family member/caregiver       Patient will benefit from skilled therapeutic intervention in order to improve the following deficits and impairments:  Abnormal gait, Impaired sensation, Pain, Impaired flexibility, Decreased activity tolerance, Decreased strength  Visit Diagnosis: Radiculopathy, lumbar region     Problem List Patient Active Problem List   Diagnosis Date Noted  . Facet hypertrophy of lumbar region 10/12/2018  . Cough 06/02/2017  . Rectal bleeding 06/02/2017  . Malignant neoplasm of exocervix ([place]) 03/25/2016  . Neoplasm of female genital organ 10/27/2014  . Abnormal Papanicolaou smear of cervix with positive human papilloma virus (HPV) test 10/27/2014  . Pre-diabetes 06/23/2013  . Polyp of colon, adenomatous 03/19/2013  . Abnormal mammogram 02/28/2013    Madelyn Flavors PT 01/25/2019, 2:56 PM  [facility] [place],  [place], [address] Phone: [phone]   Fax:  [phone]  Name: Tasnim Balentine MRN: [id number] Date of Birth: [birth year]  PHYSICAL THERAPY DISCHARGE SUMMARY  Visits from Start of Care: 11  Current functional level related to goals / functional outcomes: See Above   Remaining deficits: See Above   Education / Equipment: HEP Plan: Patient agrees to discharge.  Patient goals were partially  met. Patient is being discharged due to being pleased with the current functional level.  ?????    Madelyn Flavors, PT 02/17/19 9:12 AM  [facility] [place], [place] [address]  [phone] (office) [phone] (fax)

## 2019-03-15 ENCOUNTER — Ambulatory Visit (INDEPENDENT_AMBULATORY_CARE_PROVIDER_SITE_OTHER): Payer: Self-pay | Admitting: Physician Assistant

## 2019-03-15 DIAGNOSIS — Z5329 Procedure and treatment not carried out because of patient's decision for other reasons: Secondary | ICD-10-CM

## 2019-03-15 NOTE — Progress Notes (Signed)
No show

## 2019-03-22 ENCOUNTER — Ambulatory Visit: Payer: BLUE CROSS/BLUE SHIELD | Admitting: Physician Assistant

## 2019-09-10 ENCOUNTER — Encounter: Payer: Self-pay | Admitting: Physician Assistant

## 2019-09-10 ENCOUNTER — Other Ambulatory Visit: Payer: Self-pay

## 2019-09-10 ENCOUNTER — Ambulatory Visit (INDEPENDENT_AMBULATORY_CARE_PROVIDER_SITE_OTHER): Payer: Medicare Other | Admitting: Physician Assistant

## 2019-09-10 VITALS — BP 115/67 | HR 75 | Wt 108.0 lb

## 2019-09-10 DIAGNOSIS — M7521 Bicipital tendinitis, right shoulder: Secondary | ICD-10-CM | POA: Diagnosis not present

## 2019-09-10 DIAGNOSIS — M7522 Bicipital tendinitis, left shoulder: Secondary | ICD-10-CM | POA: Diagnosis not present

## 2019-09-10 MED ORDER — MELOXICAM 15 MG PO TABS: 15.0000 mg | ORAL_TABLET | Freq: Every day | ORAL | 1 refills | Status: DC

## 2019-09-10 NOTE — Patient Instructions (Signed)
Restart mobic daily.  Ice area and avoid overuse Tens unit.  Start streches/consider PT.  If no improvement in 2 weeks let me know.  Proximal Biceps Tendinitis and Tenosynovitis Rehab Ask your health care provider which exercises are safe for you. Do exercises exactly as told by your health care provider and adjust them as directed. It is normal to feel mild stretching, pulling, tightness, or discomfort as you do these exercises. Stop right away if you feel sudden pain or your pain gets worse. Do not begin these exercises until told by your health care provider. Stretching and range-of-motion exercises These exercises warm up your muscles and joints and improve the movement and flexibility of your arm and shoulder. The exercises also help to relieve pain and stiffness. Forearm rotation 1. Stand or sit with your left / right elbow bent in a 90-degree (right angle). Position your forearm so that the thumb is facing the ceiling (neutral position). 2. Rotate your palm up until it cannot go any farther. 3. Hold this position for __________ seconds. 4. Rotate your palm down until it cannot go any farther. 5. Hold this position for __________ seconds. Repeat __________ times. Complete this exercise __________ times a day. Elbow range of motion 1. Stand or sit with your left / right elbow bent in a 90-degree angle (right angle). Position your forearm so that the thumb is facing the ceiling (neutral position). 2. Slowly bend your elbow as far as you can until you feel a stretch or cannot go any farther. 3. Hold this position for __________ seconds. 4. Slowly straighten your elbow as far as you can until you feel a stretch or cannot go any farther. 5. Hold this position for __________ seconds. Repeat __________ times. Complete this exercise __________ times a day. Biceps stretch 1. Stand facing a wall, or stand by a door frame. 2. Raise your left / right arm out to your side, to your shoulder height.  Place the thumb side of your hand against the wall. Your palm should be facing the floor (palm down). 3. Keeping your arm straight, rotate your body in the opposite direction of the raised arm until you feel a gentle stretch in your biceps. 4. Hold this position for __________ seconds. 5. Slowly return to the starting position. Repeat __________ times. Complete this exercise __________ times a day. Shoulder pendulum  1. Stand near a table or counter that you can hold onto for balance. 2. Bend forward at the waist and let your left / right arm hang straight down. Use your other arm to support you and help you stay balanced. 3. Relax your left / right arm and shoulder muscles, and move your hips and your trunk so your left / right arm swings freely. Your arm should swing because of the motion of your body, not because you are using your arm or shoulder muscles. 4. Keep moving your hips and trunk so your arm swings in the following directions, as told by your health care provider: ? Side to side. ? Forward and backward. ? In clockwise and counterclockwise circles. Repeat __________ times. Complete this exercise __________ times a day. Shoulder flexion, assisted  1. Stand facing a wall. Put your left / right palm on the wall. 2. Slowly move your left / right hand up the wall (flexion). Stop when you feel a stretch in your shoulder, or when you reach the angle that is recommended by your health care provider. ? Use your other hand to help  raise your arm, if needed (assisted). ? As your hand gets higher, you may need to step closer to the wall. ? Avoid shrugging or lifting your shoulder up as you raise your arm. To do this, keep your shoulder blade tucked down toward your spine. 3. Hold this position for __________ seconds. 4. Slowly return to the starting position. Use your other arm to help, if needed. Repeat __________ times. Complete this exercise __________ times a day. Shoulder  flexion 1. Stand with your left / right arm hanging down at your side. 2. Keep your arm straight as you lift your arm forward and toward the ceiling (flexion). 3. Hold this position for __________ seconds. 4. Slowly return to the starting position. Repeat __________ times. Complete this exercise __________ times a day. Sleeper stretch, assisted 1. Lie on your left / right side (injured side) with your hips and knees bent and your left / right arm straight in front of you. 2. Bend your elbow to a 90-degree angle (right angle), so your fingers are pointing to the ceiling. 3. Use your other hand to gently push your arm toward the floor (assisted), stopping when you feel a gentle stretch. ? Keep your shoulder blades lightly squeezed together during the exercise. 4. Hold this position for __________ seconds. 5. Slowly return to the starting position. Repeat __________ times. Complete this exercise __________ times a day. Strengthening exercises These exercises build strength and endurance in your arm and shoulder. Endurance is the ability to use your muscles for a long time, even after they get tired. Biceps curls You can use a weight or an exercise band for this exercise. 1. Sit on a stable chair without armrests, or stand up. 2. Hold a __________ weight in your left / right hand, or hold an exercise band with both hands. Your palms should face up toward the ceiling at the starting position. 3. Bend your left / right elbow and move your hand up toward your shoulder. Keep your other arm straight down, in the starting position. 4. Hold this position for __________ seconds. 5. Slowly return to the starting position. Repeat __________ times. Complete this exercise __________ times a day. Internal shoulder rotation You will use an exercise band secured to a stable object at waist height for this exercise. A door and doorframe work well. 1. Stand sideways next to a door with your left / right arm  closest to the door, holding the exercise band in your hand. 2. With your elbow bent in a 90-degree angle (right angle) and keeping your elbow at your side, bring your hand toward your belly (internal rotation). ? Make sure your wrist is staying straight as you do this exercise. 3. Hold this position for __________ seconds. 4. Slowly return to the starting position. Repeat __________ times. Complete this exercise __________ times a day. External shoulder rotation You will use an exercise band secured to a stable object at waist height for this exercise. A door and doorframe work well. 1. Stand sideways next to a door with your left / right arm away from the door, holding the exercise band in your hand. 2. With your elbow bent in a 90-degree angle (right angle) and keeping your elbow at your side, swing your arm away from your body (external rotation). ? Make sure your wrist is staying straight as you do this exercise. 3. Hold this position for __________ seconds. 4. Slowly return to the starting position. Repeat __________ times. Complete this exercise __________ times a  day. External shoulder rotation, side-lying You will use a weight to do this exercise. 1. Lie on your uninjured side with your left / right arm at your side. Bend your elbow to a 90-degree angle (right angle). Hold a __________ weight in your left / right hand. 2. Keeping your elbow at your side, raise your arm toward the ceiling (external rotation). ? Make sure your wrist is staying straight as you do this exercise. 3. Hold this position for __________ seconds. 4. Slowly return to the starting position. Repeat __________ times. Complete this exercise __________ times a day. Scapular retraction Scapular retraction is the process of pulling the shoulder blades (scapulae) toward each other, and toward the spine. You will need an exercise band to do this exercise. 1. Sit in a stable chair without armrests, or stand  up. 2. Secure an exercise band to a stable object in front of you so the band is at shoulder height. 3. Hold one end of the exercise band in each hand. 4. Squeeze your shoulder blades together and move your elbows slightly behind you (retraction). Do not shrug your shoulders upward while you do this. 5. Hold this position for __________ seconds. 6. Slowly return to the starting position. Repeat __________ times. Complete this exercise __________ times a day. Scapular protraction, supine Scapular protraction is the process of moving your shoulder blades away from each other, and away from the spine, while you lie on your back (supine position). 1. Lie on your back on a firm surface. Hold a __________ weight in your left / right hand. 2. Raise your left / right arm straight into the air so your hand is directly above your shoulder joint. 3. Push the weight into the air so your shoulder (scapula) lifts off the surface that you are lying on. Think of trying to punch the ceiling by only moving your scapula forward (protraction). Do not move your head, neck, or back. 4. Hold this position for __________ seconds. 5. Slowly return to the starting position. Repeat __________ times. Complete this exercise __________ times a day. This information is not intended to replace advice given to you by your health care provider. Make sure you discuss any questions you have with your health care provider. Document Revised: 04/28/2018 Document Reviewed: 01/12/2018 Elsevier Patient Education  [place].
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
## 2019-09-15 ENCOUNTER — Encounter: Payer: Self-pay | Admitting: Physician Assistant

## 2019-09-27 ENCOUNTER — Telehealth: Payer: Self-pay | Admitting: Neurology

## 2019-09-27 NOTE — Telephone Encounter (Signed)
Patient's daughter called and left vm asking for a call back at [phone] with no other information.

## 2019-09-28 NOTE — Telephone Encounter (Signed)
Patient was seen 09/10/2019 for arm pain.  Instructions given: Restart mobic daily.  Ice area and avoid overuse Tens unit.  Start streches/consider PT.  If no improvement in 2 weeks let me know.  I spoke with daughter and who states they have used the Mobic, used muscle rubs, ice, and doing daily stretches. She has had no improvement in pain at all. She is willing to do physical therapy if needed. Please advise on next steps.

## 2019-09-29 NOTE — Telephone Encounter (Signed)
Lets get your scheduled with Dr. Darene Lamer sports medicine here in office.

## 2019-09-29 NOTE — Telephone Encounter (Signed)
Patient's daughter made aware. Transferred to the front desk to schedule.

## 2019-10-12 ENCOUNTER — Ambulatory Visit (INDEPENDENT_AMBULATORY_CARE_PROVIDER_SITE_OTHER): Payer: Medicare Other | Admitting: Sports Medicine

## 2019-10-12 ENCOUNTER — Ambulatory Visit (INDEPENDENT_AMBULATORY_CARE_PROVIDER_SITE_OTHER): Payer: Medicare Other

## 2019-10-12 ENCOUNTER — Encounter: Payer: Self-pay | Admitting: Sports Medicine

## 2019-10-12 ENCOUNTER — Other Ambulatory Visit: Payer: Self-pay

## 2019-10-12 DIAGNOSIS — M25511 Pain in right shoulder: Secondary | ICD-10-CM | POA: Diagnosis not present

## 2019-10-12 DIAGNOSIS — G8929 Other chronic pain: Secondary | ICD-10-CM | POA: Diagnosis not present

## 2019-10-12 DIAGNOSIS — M25512 Pain in left shoulder: Secondary | ICD-10-CM

## 2019-10-12 MED ORDER — IBUPROFEN 800 MG PO TABS: 800.0000 mg | ORAL_TABLET | Freq: Three times a day (TID) | ORAL | 2 refills | Status: DC | PRN

## 2019-10-12 NOTE — Assessment & Plan Note (Signed)
This is a very pleasant 64 year old female, her daughter interpreted for me today. She has had several months of pain in both shoulders, localized laterally. Worse with most motions including abduction. On exam she really has few impingement signs but more signs of glenohumeral dysfunction including loss of external rotation right worse than left, positive crank test bilaterally. Good cuff strength. We will get x-rays of both shoulders, switch from meloxicam back to ibuprofen 800 3 times daily, adding formal physical therapy. Return to see me in 1 month, bilateral glenohumeral injections if no better.

## 2019-10-12 NOTE — Progress Notes (Signed)
    Procedures performed today:    None.  Independent interpretation of notes and tests performed by another provider:   None.  Brief History, Exam, Impression, and Recommendations:    Bilateral shoulder pain This is a very pleasant 64 year old female, her daughter interpreted for me today. She has had several months of pain in both shoulders, localized laterally. Worse with most motions including abduction. On exam she really has few impingement signs but more signs of glenohumeral dysfunction including loss of external rotation right worse than left, positive crank test bilaterally. Good cuff strength. We will get x-rays of both shoulders, switch from meloxicam back to ibuprofen 800 3 times daily, adding formal physical therapy. Return to see me in 1 month, bilateral glenohumeral injections if no better.    ___________________________________________ Gwen Her. Dianah Field, M.D., ABFM., CAQSM. Primary Care and [place] Instructor of [place] [facility]

## 2019-10-26 ENCOUNTER — Ambulatory Visit: Payer: Medicare Other | Admitting: Rehabilitative and Restorative Service Providers"

## 2019-11-01 ENCOUNTER — Ambulatory Visit: Payer: Medicare Other | Attending: Sports Medicine | Admitting: Physical Therapy

## 2019-11-01 ENCOUNTER — Other Ambulatory Visit: Payer: Self-pay

## 2019-11-01 ENCOUNTER — Encounter: Payer: Self-pay | Admitting: Physical Therapy

## 2019-11-01 DIAGNOSIS — M25511 Pain in right shoulder: Secondary | ICD-10-CM | POA: Insufficient documentation

## 2019-11-01 DIAGNOSIS — M25611 Stiffness of right shoulder, not elsewhere classified: Secondary | ICD-10-CM

## 2019-11-01 DIAGNOSIS — M25512 Pain in left shoulder: Secondary | ICD-10-CM | POA: Insufficient documentation

## 2019-11-01 DIAGNOSIS — M6281 Muscle weakness (generalized): Secondary | ICD-10-CM | POA: Insufficient documentation

## 2019-11-01 DIAGNOSIS — M25612 Stiffness of left shoulder, not elsewhere classified: Secondary | ICD-10-CM | POA: Diagnosis present

## 2019-11-01 DIAGNOSIS — G8929 Other chronic pain: Secondary | ICD-10-CM
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
## 2019-11-08 ENCOUNTER — Encounter: Payer: Self-pay | Admitting: Physical Therapy

## 2019-11-08 ENCOUNTER — Other Ambulatory Visit: Payer: Self-pay

## 2019-11-08 ENCOUNTER — Ambulatory Visit: Payer: Medicare Other | Admitting: Physical Therapy

## 2019-11-08 DIAGNOSIS — M6281 Muscle weakness (generalized): Secondary | ICD-10-CM

## 2019-11-08 DIAGNOSIS — M25612 Stiffness of left shoulder, not elsewhere classified: Secondary | ICD-10-CM

## 2019-11-08 DIAGNOSIS — M25511 Pain in right shoulder: Secondary | ICD-10-CM | POA: Diagnosis not present

## 2019-11-08 DIAGNOSIS — M25611 Stiffness of right shoulder, not elsewhere classified: Secondary | ICD-10-CM

## 2019-11-08 DIAGNOSIS — G8929 Other chronic pain: Secondary | ICD-10-CM

## 2019-11-08 NOTE — Therapy (Signed)
[facility] [address]  [place], [place], [address] Phone: [phone]   Fax:  [phone]  Physical Therapy Treatment  Patient Details  Name: Haley Francis MRN: [id number] Date of Birth: 1955/04/06 Referring Provider (PT): Aundria Mems, MD   Encounter Date: 11/08/2019   PT End of Session - 11/08/19 1451    Visit Number 2    Number of Visits 13    Date for PT Re-Evaluation 12/13/19    Authorization Type Medicare & Medicaid    PT Start Time 4580    PT Stop Time 1445    PT Time Calculation (min) 42 min    Activity Tolerance Patient tolerated treatment well;Patient limited by pain    Behavior During Therapy [facility] for tasks assessed/performed           History reviewed. No pertinent past medical history.  History reviewed. No pertinent surgical history.  There were no vitals filed for this visit.   Subjective Assessment - 11/08/19 1406    Subjective Finds that the exercises have been helping a little bit. Daughter notes that patient has some questions on her exercises.    Patient is accompained by: Family member;Interpreter    Pertinent History pre-diabetes, malignant neoplasm of exocervix    Diagnostic tests 10/12/19 B shoulder xrays: negative    Patient Stated Goals decrease pain    Currently in Pain? Yes    Pain Score 3     Pain Location Shoulder    Pain Orientation Right;Anterior;Posterior    Pain Descriptors / Indicators Constant    Pain Type Chronic pain                             [facility] Adult PT Treatment/Exercise - 11/08/19 0001      Exercises   Exercises Shoulder;Hand      Shoulder Exercises: Supine   External Rotation AAROM;Right;Left;5 reps    External Rotation Limitations with wand   c/o mild pain on R   Flexion AAROM;Both;5 reps;Strengthening    Flexion Limitations with wand 5x, with 1# 5x L, with 0# R   improved ROM and tolerance   ABduction AAROM;Right;Left;5  reps    ABduction Limitations with wand    Other Supine Exercises R/L shoulder IR/ER in supine with 1# to tolerable end range x10 each    Other Supine Exercises R/L forward fold shoulder ER stretch with arm on white bolster 5x5" to tolerance   cues to avoid rounding spine and to relax shoulders     Shoulder Exercises: Sidelying   ABduction Strengthening;Right;Left;5 reps    ABduction Limitations thumb up; 5x AROM, 5x 1#   mild pain on L     Shoulder Exercises: Standing   Row Strengthening;Both;10 reps;Theraband    Theraband Level (Shoulder Row) Level 1 (Yellow)    Row Limitations slight R scapular winging      Shoulder Exercises: Pulleys   Flexion 3 minutes    Flexion Limitations to tolerance    Scaption 3 minutes    Scaption Limitations to tolerance      Hand Exercises   Other Hand Exercises practice and demonstrating of yellow putty exercises for B hands including full handed grip, 3 pt pinch, key pinch                  PT Education - 11/08/19 1450    Education Details update to HEP- adminstered yellow putty  Person(s) Educated Patient    Methods Explanation;Demonstration;Tactile cues;Verbal cues;Handout    Comprehension Verbalized understanding;Returned demonstration            PT Short Term Goals - 11/08/19 1452      PT SHORT TERM GOAL #1   Title Patient to be independent with initial HEP.    Time 3    Period Weeks    Status Achieved    Target Date 11/22/19             PT Long Term Goals - 11/08/19 1453      PT LONG TERM GOAL #1   Title Patient to be independent with advanced HEP.    Time 6    Period Weeks    Status On-going      PT LONG TERM GOAL #2   Title Patient to demonstrate B UE strength >/=4+/5.    Time 6    Period Weeks    Status On-going      PT LONG TERM GOAL #3   Title Patient to demonstrate B shoulder AROM/PROM WFL and without pain limiting.    Time 6    Period Weeks    Status On-going      PT LONG TERM GOAL #4   Title  Patient to report tolerance for dressing without pain remaining.    Time 6    Period Weeks    Status On-going                 Plan - 11/08/19 1451    Clinical Impression Statement Patient's daughter reporting that patient has some questions on HEP. Patient noting "a little bit" of benefit from HEP thus far. Reviewed shoulder AAROM exercises, with patient requiring cues to avoid rounding spine and to relax shoulders in sitting. Good tolerance for shoulder AAROM with wand demonstrated, thus able to increase with light resistance on L with flexion. Patient still somewhat limited by pain on R, thus performed without additional weight into flexion. Able to use light weights resistance with shoulder IR/ER stretching, with most limitation demonstrated on R. Initiated periscapular strengthening, with slight R scapular winging evident. Updated HEP with grip strengthening which patient and daughter reported understanding of. Ended session without complaints.    PT Treatment/Interventions ADLs/Self Care Home Management;Cryotherapy;Electrical Stimulation;Iontophoresis 4mg /ml Dexamethasone;Moist Heat;Therapeutic exercise;Therapeutic activities;Functional mobility training;Ultrasound;Neuromuscular re-education;Patient/family education;Manual techniques;Vasopneumatic Device;Taping;Energy conservation;Dry needling;Passive range of motion    PT Next Visit Plan progress B shoulder ROM and strength    Consulted and Agree with Plan of Care Patient;Family member/caregiver    Family Member Consulted daughterJanett Billow           Patient will benefit from skilled therapeutic intervention in order to improve the following deficits and impairments:  Hypomobility, Increased edema, Pain, Impaired UE functional use, Increased fascial restricitons, Decreased strength, Decreased activity tolerance, Increased muscle spasms, Improper body mechanics, Decreased range of motion, Impaired flexibility, Postural  dysfunction  Visit Diagnosis: Chronic right shoulder pain  Stiffness of right shoulder, not elsewhere classified  Chronic left shoulder pain  Stiffness of left shoulder, not elsewhere classified  Muscle weakness (generalized)     Problem List Patient Active Problem List   Diagnosis Date Noted  . Bilateral shoulder pain 10/12/2019  . Facet hypertrophy of lumbar region 10/12/2018  . Cough 06/02/2017  . Rectal bleeding 06/02/2017  . Malignant neoplasm of exocervix ([place]) 03/25/2016  . Neoplasm of female genital organ 10/27/2014  . Abnormal Papanicolaou smear of cervix with positive human papilloma virus (HPV) test 10/27/2014  .  Pre-diabetes 06/23/2013  . Polyp of colon, adenomatous 03/19/2013  . Abnormal mammogram 02/28/2013     Janene Harvey, PT, DPT 11/08/19 3:03 PM   [facility] [address]  [place], [place], [address] Phone: [phone]   Fax:  [phone]  Name: Haley Francis MRN: [id number] Date of Birth: Nov 01, 1955

## 2019-11-12 ENCOUNTER — Other Ambulatory Visit: Payer: Self-pay

## 2019-11-12 ENCOUNTER — Ambulatory Visit: Payer: Medicare Other

## 2019-11-12 DIAGNOSIS — G8929 Other chronic pain: Secondary | ICD-10-CM

## 2019-11-12 DIAGNOSIS — M25611 Stiffness of right shoulder, not elsewhere classified: Secondary | ICD-10-CM

## 2019-11-12 DIAGNOSIS — M6281 Muscle weakness (generalized): Secondary | ICD-10-CM

## 2019-11-12 DIAGNOSIS — M25512 Pain in left shoulder: Secondary | ICD-10-CM

## 2019-11-12 DIAGNOSIS — M25612 Stiffness of left shoulder, not elsewhere classified: Secondary | ICD-10-CM

## 2019-11-12 DIAGNOSIS — M25511 Pain in right shoulder: Secondary | ICD-10-CM | POA: Diagnosis not present

## 2019-11-12 NOTE — Therapy (Signed)
[facility] [address]  [place], [place], [address] Phone: [phone]   Fax:  [phone]  Physical Therapy Treatment  Patient Details  Name: Haley Francis MRN: [id number] Date of Birth: 08/21/1955 Referring Provider (PT): Aundria Mems, MD   Encounter Date: 11/12/2019   PT End of Session - 11/12/19 1018    Visit Number 3    Number of Visits 13    Date for PT Re-Evaluation 12/13/19    Authorization Type Medicare & Medicaid    PT Start Time 0803    PT Stop Time 9373    PT Time Calculation (min) 41 min    Activity Tolerance Patient tolerated treatment well;Patient limited by pain    Behavior During Therapy [facility] for tasks assessed/performed           History reviewed. No pertinent past medical history.  History reviewed. No pertinent surgical history.  There were no vitals filed for this visit.   Subjective Assessment - 11/12/19 0815    Subjective Pt. noting most difficulty at home while cutting meat on counter.    Patient is accompained by: Family member;Interpreter    Pertinent History pre-diabetes, malignant neoplasm of exocervix    Diagnostic tests 10/12/19 B shoulder xrays: negative    Patient Stated Goals decrease pain    Currently in Pain? Yes    Pain Score 5     Pain Location Shoulder    Pain Orientation Right                             [facility] Adult PT Treatment/Exercise - 11/12/19 0001      Shoulder Exercises: Supine   External Rotation AAROM;Right;Left;10 reps    External Rotation Limitations with wand    Flexion AAROM;Both;Strengthening;10 reps    Flexion Limitations with wand     ABduction AAROM;Right;Left;10 reps    ABduction Limitations scaption with wand       Shoulder Exercises: Sidelying   External Rotation Right;10 reps;AROM    External Rotation Limitations pillow by side     ABduction Left;Right;10 reps    ABduction Limitations thumb up     Other  Sidelying Exercises R horizontal abduction x 10 - cues for scapular retraction       Shoulder Exercises: Pulleys   Flexion 3 minutes    Flexion Limitations to tolerance    Scaption 3 minutes    Scaption Limitations to tolerance                  PT Education - 11/12/19 1017    Education Details HEP update    Person(s) Educated Patient    Methods Explanation;Demonstration;Verbal cues;Handout    Comprehension Verbalized understanding;Returned demonstration;Verbal cues required            PT Short Term Goals - 11/08/19 1452      PT SHORT TERM GOAL #1   Title Patient to be independent with initial HEP.    Time 3    Period Weeks    Status Achieved    Target Date 11/22/19             PT Long Term Goals - 11/08/19 1453      PT LONG TERM GOAL #1   Title Patient to be independent with advanced HEP.    Time 6    Period Weeks    Status On-going      PT LONG TERM  GOAL #2   Title Patient to demonstrate B UE strength >/=4+/5.    Time 6    Period Weeks    Status On-going      PT LONG TERM GOAL #3   Title Patient to demonstrate B shoulder AROM/PROM WFL and without pain limiting.    Time 6    Period Weeks    Status On-going      PT LONG TERM GOAL #4   Title Patient to report tolerance for dressing without pain remaining.    Time 6    Period Weeks    Status On-going                 Plan - 11/12/19 1018    Clinical Impression Statement Patient with complaint of R superior shoulder pain and notes via daughter interpreting no sig pain in L shoulder.  Most pain reaching behind her with R shoulder and overhead.  Progressed AAROM activities today along with gentle sidelying AROM for R shoulder to tolerance.  Did require close supervision for proper technique with therex today.  updated HEP with pt. and daughter noting [place] instruction made sense.    Rehab Potential Good    PT Frequency 2x / week    PT Duration 6 weeks    PT Treatment/Interventions  ADLs/Self Care Home Management;Cryotherapy;Electrical Stimulation;Iontophoresis 4mg /ml Dexamethasone;Moist Heat;Therapeutic exercise;Therapeutic activities;Functional mobility training;Ultrasound;Neuromuscular re-education;Patient/family education;Manual techniques;Vasopneumatic Device;Taping;Energy conservation;Dry needling;Passive range of motion    PT Next Visit Plan progress B shoulder ROM and strength    Consulted and Agree with Plan of Care Patient;Family member/caregiver    Family Member Consulted daughterJanett Billow           Patient will benefit from skilled therapeutic intervention in order to improve the following deficits and impairments:  Hypomobility, Increased edema, Pain, Impaired UE functional use, Increased fascial restricitons, Decreased strength, Decreased activity tolerance, Increased muscle spasms, Improper body mechanics, Decreased range of motion, Impaired flexibility, Postural dysfunction  Visit Diagnosis: Chronic right shoulder pain  Stiffness of right shoulder, not elsewhere classified  Chronic left shoulder pain  Stiffness of left shoulder, not elsewhere classified  Muscle weakness (generalized)     Problem List Patient Active Problem List   Diagnosis Date Noted  . Bilateral shoulder pain 10/12/2019  . Facet hypertrophy of lumbar region 10/12/2018  . Cough 06/02/2017  . Rectal bleeding 06/02/2017  . Malignant neoplasm of exocervix ([place]) 03/25/2016  . Neoplasm of female genital organ 10/27/2014  . Abnormal Papanicolaou smear of cervix with positive human papilloma virus (HPV) test 10/27/2014  . Pre-diabetes 06/23/2013  . Polyp of colon, adenomatous 03/19/2013  . Abnormal mammogram 02/28/2013    [name], PTA 11/12/19 12:39 PM   [facility] [address]  [place], [place], [address] Phone: [phone]   Fax:  [phone]  Name: Angelia Hazell MRN: [id number] Date of Birth:  Jul 30, 1955

## 2019-11-15 ENCOUNTER — Ambulatory Visit: Payer: Medicare Other | Attending: Sports Medicine

## 2019-11-15 ENCOUNTER — Other Ambulatory Visit: Payer: Self-pay

## 2019-11-15 DIAGNOSIS — M6281 Muscle weakness (generalized): Secondary | ICD-10-CM | POA: Insufficient documentation

## 2019-11-15 DIAGNOSIS — M25511 Pain in right shoulder: Secondary | ICD-10-CM | POA: Diagnosis present

## 2019-11-15 DIAGNOSIS — G8929 Other chronic pain: Secondary | ICD-10-CM | POA: Diagnosis present

## 2019-11-15 DIAGNOSIS — M25512 Pain in left shoulder: Secondary | ICD-10-CM | POA: Diagnosis present

## 2019-11-15 DIAGNOSIS — M25611 Stiffness of right shoulder, not elsewhere classified: Secondary | ICD-10-CM

## 2019-11-15 DIAGNOSIS — M25612 Stiffness of left shoulder, not elsewhere classified: Secondary | ICD-10-CM | POA: Diagnosis present

## 2019-11-15 NOTE — Therapy (Signed)
[facility] [address]  [place], [place], [address] Phone: [phone]   Fax:  [phone]  Physical Therapy Treatment  Patient Details  Name: Haley Francis MRN: [id number] Date of Birth: 10/07/1955 Referring Provider (PT): Aundria Mems, MD   Encounter Date: 11/15/2019   PT End of Session - 11/15/19 1404    Visit Number 4    Number of Visits 13    Date for PT Re-Evaluation 12/13/19    Authorization Type Medicare & Medicaid    PT Start Time 1401    PT Stop Time 1740    PT Time Calculation (min) 44 min    Activity Tolerance Patient tolerated treatment well;Patient limited by pain    Behavior During Therapy [facility] for tasks assessed/performed           History reviewed. No pertinent past medical history.  History reviewed. No pertinent surgical history.  There were no vitals filed for this visit.   Subjective Assessment - 11/15/19 1406    Subjective Pt. noting no issues with HEP over weekend.    Patient is accompained by: Family member;Interpreter   daughter   Pertinent History pre-diabetes, malignant neoplasm of exocervix    Diagnostic tests 10/12/19 B shoulder xrays: negative    Patient Stated Goals decrease pain    Currently in Pain? Yes    Pain Score 3     Pain Location Shoulder    Pain Orientation Right    Pain Descriptors / Indicators Constant    Pain Type Chronic pain    Multiple Pain Sites No                             [facility] Adult PT Treatment/Exercise - 11/15/19 0001      Self-Care   Self-Care Other Self-Care Comments    Other Self-Care Comments  review of updated HEP and previous HEP to check for understanding and instruct pt. to stop performing supine AAROM and replace with seated AAROM activities in updated HEP       Shoulder Exercises: Seated   External Rotation Left;AAROM;10 reps    External Rotation Limitations wand     Flexion Left;AAROM;5 reps    Flexion  Limitations wand     Abduction Left;AAROM;5 reps    ABduction Limitations wand       Shoulder Exercises: Standing   Row 15 reps;Strengthening;Both    Theraband Level (Shoulder Row) Level 1 (Yellow)    Row Limitations Improved scapular retraction motion with tactile cueing      Shoulder Exercises: Pulleys   Flexion 3 minutes    Flexion Limitations to tolerance    Scaption 3 minutes    Scaption Limitations to tolerance                  PT Education - 11/15/19 1734    Education Details HEP update    Person(s) Educated Patient    Methods Explanation;Demonstration;Verbal cues;Handout    Comprehension Verbalized understanding;Returned demonstration;Verbal cues required            PT Short Term Goals - 11/08/19 1452      PT SHORT TERM GOAL #1   Title Patient to be independent with initial HEP.    Time 3    Period Weeks    Status Achieved    Target Date 11/22/19             PT Long Term Goals -  11/08/19 1453      PT LONG TERM GOAL #1   Title Patient to be independent with advanced HEP.    Time 6    Period Weeks    Status On-going      PT LONG TERM GOAL #2   Title Patient to demonstrate B UE strength >/=4+/5.    Time 6    Period Weeks    Status On-going      PT LONG TERM GOAL #3   Title Patient to demonstrate B shoulder AROM/PROM WFL and without pain limiting.    Time 6    Period Weeks    Status On-going      PT LONG TERM GOAL #4   Title Patient to report tolerance for dressing without pain remaining.    Time 6    Period Weeks    Status On-going                 Plan - 11/15/19 1405    Clinical Impression Statement Pt. doing well noting improved shoulder ROM since starting therapy.  Was able to progress to gravity resisted AAROM flexion, abduction motions with cane assistance without issue thus updated HEP accordingly.  Pt. able to demonstrate good overall R scapulohumeral mechanics with AAROM assistance however scapular hiking with AROM  overhead reaching which is currently giving her pain.  Ended visit with HEP update issued via handout.    Rehab Potential Good    PT Treatment/Interventions ADLs/Self Care Home Management;Cryotherapy;Electrical Stimulation;Iontophoresis 4mg /ml Dexamethasone;Moist Heat;Therapeutic exercise;Therapeutic activities;Functional mobility training;Ultrasound;Neuromuscular re-education;Patient/family education;Manual techniques;Vasopneumatic Device;Taping;Energy conservation;Dry needling;Passive range of motion    PT Next Visit Plan progress B shoulder ROM and strength    Consulted and Agree with Plan of Care Patient;Family member/caregiver    Family Member Consulted daughterJanett Billow           Patient will benefit from skilled therapeutic intervention in order to improve the following deficits and impairments:  Hypomobility, Increased edema, Pain, Impaired UE functional use, Increased fascial restricitons, Decreased strength, Decreased activity tolerance, Increased muscle spasms, Improper body mechanics, Decreased range of motion, Impaired flexibility, Postural dysfunction  Visit Diagnosis: Chronic right shoulder pain  Stiffness of right shoulder, not elsewhere classified  Chronic left shoulder pain  Stiffness of left shoulder, not elsewhere classified     Problem List Patient Active Problem List   Diagnosis Date Noted  . Bilateral shoulder pain 10/12/2019  . Facet hypertrophy of lumbar region 10/12/2018  . Cough 06/02/2017  . Rectal bleeding 06/02/2017  . Malignant neoplasm of exocervix ([place]) 03/25/2016  . Neoplasm of female genital organ 10/27/2014  . Abnormal Papanicolaou smear of cervix with positive human papilloma virus (HPV) test 10/27/2014  . Pre-diabetes 06/23/2013  . Polyp of colon, adenomatous 03/19/2013  . Abnormal mammogram 02/28/2013    [name], PTA 11/15/19 5:38 PM   [facility] [address]  [place], [place], [address] Phone: [phone]   Fax:  [phone]  Name: Briyanna Billingham MRN: [id number] Date of Birth: 1955-04-18

## 2019-11-17 ENCOUNTER — Ambulatory Visit: Payer: Medicare Other

## 2019-11-17 ENCOUNTER — Other Ambulatory Visit: Payer: Self-pay

## 2019-11-17 DIAGNOSIS — M25612 Stiffness of left shoulder, not elsewhere classified: Secondary | ICD-10-CM

## 2019-11-17 DIAGNOSIS — M6281 Muscle weakness (generalized): Secondary | ICD-10-CM

## 2019-11-17 DIAGNOSIS — M25511 Pain in right shoulder: Secondary | ICD-10-CM | POA: Diagnosis not present

## 2019-11-17 DIAGNOSIS — G8929 Other chronic pain: Secondary | ICD-10-CM

## 2019-11-17 DIAGNOSIS — M25611 Stiffness of right shoulder, not elsewhere classified: Secondary | ICD-10-CM

## 2019-11-17 DIAGNOSIS — M25512 Pain in left shoulder: Secondary | ICD-10-CM

## 2019-11-17 NOTE — Therapy (Signed)
[facility] [address]  [place], [place], [address] Phone: [phone]   Fax:  [phone]  Physical Therapy Treatment  Patient Details  Name: Haley Francis MRN: [id number] Date of Birth: 10/12/1955 Referring Provider (PT): Aundria Mems, MD   Encounter Date: 11/17/2019   PT End of Session - 11/17/19 1025    Visit Number 5    Number of Visits 13    Date for PT Re-Evaluation 12/13/19    Authorization Type Medicare & Medicaid    PT Start Time 1017    PT Stop Time 1100    PT Time Calculation (min) 43 min    Activity Tolerance Patient tolerated treatment well;Patient limited by pain    Behavior During Therapy [facility] for tasks assessed/performed           History reviewed. No pertinent past medical history.  History reviewed. No pertinent surgical history.  There were no vitals filed for this visit.   Subjective Assessment - 11/17/19 1021    Subjective Pt. doing ok.  has been having some shld pain in the AM.    Pertinent History pre-diabetes, malignant neoplasm of exocervix    Diagnostic tests 10/12/19 B shoulder xrays: negative    Patient Stated Goals decrease pain    Currently in Pain? No/denies    Pain Score 0-No pain   pain in night up to 5/10   Pain Location Shoulder    Pain Orientation Right    Pain Type Chronic pain    Multiple Pain Sites No    Pain Score 0    Pain Location Shoulder    Pain Orientation Left                             [facility] Adult PT Treatment/Exercise - 11/17/19 0001      Shoulder Exercises: Supine   External Rotation AAROM;Right;Left;10 reps    External Rotation Limitations with wand      Shoulder Exercises: Seated   External Rotation Left;AAROM;10 reps    External Rotation Limitations wand     Flexion Left;AAROM;10 reps    Flexion Limitations wand     Abduction Left;AAROM;10 reps    ABduction Limitations wand       Shoulder Exercises: Pulleys    Flexion 3 minutes    Flexion Limitations to tolerance    Scaption 3 minutes    Scaption Limitations to tolerance      Manual Therapy   Manual Therapy Passive ROM;Joint mobilization    Manual therapy comments supine     Joint Mobilization R shoulder inferior, anterior, posterior mobilizations grade III for improved ROM    Passive ROM R shoulder PROM all directions with gentle stretch                     PT Short Term Goals - 11/08/19 1452      PT SHORT TERM GOAL #1   Title Patient to be independent with initial HEP.    Time 3    Period Weeks    Status Achieved    Target Date 11/22/19             PT Long Term Goals - 11/08/19 1453      PT LONG TERM GOAL #1   Title Patient to be independent with advanced HEP.    Time 6    Period Weeks    Status On-going  PT LONG TERM GOAL #2   Title Patient to demonstrate B UE strength >/=4+/5.    Time 6    Period Weeks    Status On-going      PT LONG TERM GOAL #3   Title Patient to demonstrate B shoulder AROM/PROM WFL and without pain limiting.    Time 6    Period Weeks    Status On-going      PT LONG TERM GOAL #4   Title Patient to report tolerance for dressing without pain remaining.    Time 6    Period Weeks    Status On-going                 Plan - 11/17/19 1027    Clinical Impression Statement Pt. doing ok.  Noting improved ROM.  Able to demonstrate significant improvement in R shoulder PROM following manual mobilizations and PROM with prolonged holds + traction.  Progressed pt. AAROM activities and pt. and daughter reporting good understanding of HEP.  Ended visit pain free thus modalities deferred.    Rehab Potential Good    PT Frequency 2x / week    PT Duration 6 weeks    PT Treatment/Interventions ADLs/Self Care Home Management;Cryotherapy;Electrical Stimulation;Iontophoresis 4mg /ml Dexamethasone;Moist Heat;Therapeutic exercise;Therapeutic activities;Functional mobility  training;Ultrasound;Neuromuscular re-education;Patient/family education;Manual techniques;Vasopneumatic Device;Taping;Energy conservation;Dry needling;Passive range of motion    PT Next Visit Plan progress B shoulder ROM and strength    Consulted and Agree with Plan of Care Patient;Family member/caregiver    Family Member Consulted daughterJanett Billow           Patient will benefit from skilled therapeutic intervention in order to improve the following deficits and impairments:  Hypomobility, Increased edema, Pain, Impaired UE functional use, Increased fascial restricitons, Decreased strength, Decreased activity tolerance, Increased muscle spasms, Improper body mechanics, Decreased range of motion, Impaired flexibility, Postural dysfunction  Visit Diagnosis: Chronic right shoulder pain  Stiffness of right shoulder, not elsewhere classified  Chronic left shoulder pain  Stiffness of left shoulder, not elsewhere classified  Muscle weakness (generalized)     Problem List Patient Active Problem List   Diagnosis Date Noted  . Bilateral shoulder pain 10/12/2019  . Facet hypertrophy of lumbar region 10/12/2018  . Cough 06/02/2017  . Rectal bleeding 06/02/2017  . Malignant neoplasm of exocervix ([place]) 03/25/2016  . Neoplasm of female genital organ 10/27/2014  . Abnormal Papanicolaou smear of cervix with positive human papilloma virus (HPV) test 10/27/2014  . Pre-diabetes 06/23/2013  . Polyp of colon, adenomatous 03/19/2013  . Abnormal mammogram 02/28/2013    [name], PTA 11/17/19 12:48 PM   [facility] [address]  [place], [place], [address] Phone: [phone]   Fax:  [phone]  Name: Kristeen Lantz MRN: [id number] Date of Birth: Jun 10, 1955

## 2019-11-22 ENCOUNTER — Other Ambulatory Visit: Payer: Self-pay

## 2019-11-22 ENCOUNTER — Ambulatory Visit: Payer: Medicare Other

## 2019-11-22 DIAGNOSIS — M6281 Muscle weakness (generalized): Secondary | ICD-10-CM

## 2019-11-22 DIAGNOSIS — M25612 Stiffness of left shoulder, not elsewhere classified: Secondary | ICD-10-CM

## 2019-11-22 DIAGNOSIS — M25511 Pain in right shoulder: Secondary | ICD-10-CM

## 2019-11-22 DIAGNOSIS — M25512 Pain in left shoulder: Secondary | ICD-10-CM

## 2019-11-22 DIAGNOSIS — M25611 Stiffness of right shoulder, not elsewhere classified: Secondary | ICD-10-CM

## 2019-11-22 DIAGNOSIS — G8929 Other chronic pain: Secondary | ICD-10-CM

## 2019-11-22 NOTE — Therapy (Signed)
[facility] [address]  [place], [place], [address] Phone: [phone]   Fax:  [phone]  Physical Therapy Treatment  Patient Details  Name: Haley Francis MRN: [id number] Date of Birth: Nov 29, 1955 Referring Provider (PT): Aundria Mems, MD   Encounter Date: 11/22/2019   PT End of Session - 11/22/19 1408    Visit Number 6    Number of Visits 13    Date for PT Re-Evaluation 12/13/19    Authorization Type Medicare & Medicaid    PT Start Time 1401    PT Stop Time 1446    PT Time Calculation (min) 45 min    Activity Tolerance Patient tolerated treatment well;Patient limited by pain    Behavior During Therapy [facility] for tasks assessed/performed           No past medical history on file.  No past surgical history on file.  There were no vitals filed for this visit.   Subjective Assessment - 11/22/19 1405    Subjective Pt. reporting she had increased shoulder pain without known trigger last night which disrupted her sleep all night.    Pertinent History pre-diabetes, malignant neoplasm of exocervix    Diagnostic tests 10/12/19 B shoulder xrays: negative    Patient Stated Goals decrease pain    Currently in Pain? No/denies    Pain Score 0-No pain   8-9/10   Pain Location Shoulder    Pain Orientation Right    Pain Descriptors / Indicators Constant   Constant last night in bed   Pain Type Chronic pain    Multiple Pain Sites No              [facility] PT Assessment - 11/22/19 0001      AROM   AROM Assessment Site Shoulder    Right/Left Shoulder Right    Right Shoulder Internal Rotation --   FIR to T5 - pain    Right Shoulder External Rotation --   FER to T1                        [facility] Adult PT Treatment/Exercise - 11/22/19 0001      Elbow Exercises   Elbow Flexion --   R biceps stretch on doorframe      Shoulder Exercises: Standing   Flexion Right;10 reps;Strengthening;Weights     Shoulder Flexion Weight (lbs) 1    Flexion Limitations flexion reach to 1-2nd shelf over cabinet     ABduction Right;10 reps;Strengthening;Weights    Shoulder ABduction Weight (lbs) 1    ABduction Limitations scaption reach to 1-2nd shelf over counter     Row 10 reps;Both;Theraband;Strengthening    Theraband Level (Shoulder Row) Level 2 (Red)    Row Limitations scapular retraction       Shoulder Exercises: Therapy Ball   Flexion Right;10 reps    Flexion Limitations AAROM with orange p-ball on wall       Shoulder Exercises: ROM/Strengthening   UBE (Upper Arm Bike) UBE: lvl 1.5, 3 min forwards, 3 min backwards       Shoulder Exercises: Stretch   Other Shoulder Stretches B shoulder hyperextension with wand 5" x 10    Other Shoulder Stretches R shoulder IR stretch with wand behind back 3" x 10                  PT Education - 11/22/19 1634    Education Details HEP update  Person(s) Educated Patient    Methods Explanation;Demonstration;Tactile cues    Comprehension Verbalized understanding;Returned demonstration;Verbal cues required            PT Short Term Goals - 11/08/19 1452      PT SHORT TERM GOAL #1   Title Patient to be independent with initial HEP.    Time 3    Period Weeks    Status Achieved    Target Date 11/22/19             PT Long Term Goals - 11/08/19 1453      PT LONG TERM GOAL #1   Title Patient to be independent with advanced HEP.    Time 6    Period Weeks    Status On-going      PT LONG TERM GOAL #2   Title Patient to demonstrate B UE strength >/=4+/5.    Time 6    Period Weeks    Status On-going      PT LONG TERM GOAL #3   Title Patient to demonstrate B shoulder AROM/PROM WFL and without pain limiting.    Time 6    Period Weeks    Status On-going      PT LONG TERM GOAL #4   Title Patient to report tolerance for dressing without pain remaining.    Time 6    Period Weeks    Status On-going                 Plan -  11/22/19 1409    Clinical Impression Statement Pt. noting improved ability to lift/reach overhead since starting therapy.  Able to perform 1-2nd shelf reaching at cabinet in session without pain.  Does still having R superior/anterior shoulder pain with end ROM behind back IR and some flexion reaching which is quickly relieved with rest.  Able to progress periscapular strengthening activities without issue thus updated HEP accordingly.  Pt. and daughter verbalizing good understanding of HEP handout.    Rehab Potential Good    PT Frequency 2x / week    PT Duration 6 weeks    PT Treatment/Interventions ADLs/Self Care Home Management;Cryotherapy;Electrical Stimulation;Iontophoresis 4mg /ml Dexamethasone;Moist Heat;Therapeutic exercise;Therapeutic activities;Functional mobility training;Ultrasound;Neuromuscular re-education;Patient/family education;Manual techniques;Vasopneumatic Device;Taping;Energy conservation;Dry needling;Passive range of motion    PT Next Visit Plan progress B shoulder ROM and strength    Consulted and Agree with Plan of Care Patient;Family member/caregiver    Family Member Consulted daughterJanett Billow           Patient will benefit from skilled therapeutic intervention in order to improve the following deficits and impairments:  Hypomobility, Increased edema, Pain, Impaired UE functional use, Increased fascial restricitons, Decreased strength, Decreased activity tolerance, Increased muscle spasms, Improper body mechanics, Decreased range of motion, Impaired flexibility, Postural dysfunction  Visit Diagnosis: Chronic right shoulder pain  Stiffness of right shoulder, not elsewhere classified  Chronic left shoulder pain  Stiffness of left shoulder, not elsewhere classified  Muscle weakness (generalized)     Problem List Patient Active Problem List   Diagnosis Date Noted  . Bilateral shoulder pain 10/12/2019  . Facet hypertrophy of lumbar region 10/12/2018  . Cough  06/02/2017  . Rectal bleeding 06/02/2017  . Malignant neoplasm of exocervix ([place]) 03/25/2016  . Neoplasm of female genital organ 10/27/2014  . Abnormal Papanicolaou smear of cervix with positive human papilloma virus (HPV) test 10/27/2014  . Pre-diabetes 06/23/2013  . Polyp of colon, adenomatous 03/19/2013  . Abnormal mammogram 02/28/2013    [name], PTA  11/22/19 4:48 PM   [facility] [address]  [place], [place], [address] Phone: [phone]   Fax:  [phone]  Name: Haley Francis MRN: [id number] Date of Birth: 01-16-1955

## 2019-11-24 ENCOUNTER — Telehealth: Payer: Self-pay

## 2019-11-24 ENCOUNTER — Ambulatory Visit: Payer: Medicare Other | Admitting: Physical Therapy

## 2019-11-24 ENCOUNTER — Other Ambulatory Visit: Payer: Self-pay

## 2019-11-24 ENCOUNTER — Encounter: Payer: Self-pay | Admitting: Physical Therapy

## 2019-11-24 DIAGNOSIS — G8929 Other chronic pain: Secondary | ICD-10-CM

## 2019-11-24 DIAGNOSIS — M25512 Pain in left shoulder: Secondary | ICD-10-CM

## 2019-11-24 DIAGNOSIS — M25611 Stiffness of right shoulder, not elsewhere classified: Secondary | ICD-10-CM

## 2019-11-24 DIAGNOSIS — M25511 Pain in right shoulder: Secondary | ICD-10-CM | POA: Diagnosis not present

## 2019-11-24 DIAGNOSIS — M25612 Stiffness of left shoulder, not elsewhere classified: Secondary | ICD-10-CM

## 2019-11-24 DIAGNOSIS — M6281 Muscle weakness (generalized): Secondary | ICD-10-CM

## 2019-11-24 MED ORDER — TRAMADOL HCL 50 MG PO TABS: 50.0000 mg | ORAL_TABLET | Freq: Three times a day (TID) | ORAL | 0 refills | Status: DC | PRN

## 2019-11-24 NOTE — Telephone Encounter (Signed)
No problem, adding some tramadol.

## 2019-11-24 NOTE — Telephone Encounter (Signed)
Daughter, Janett Billow, called to report that patient is getting better ROM with PT but that the ibuprofen is not taking care of the pain.  They are requesting something a little stronger.  They use the [facility] on [address] in [place].

## 2019-11-24 NOTE — Therapy (Signed)
[facility] [address]  [place], [place], [address] Phone: [phone]   Fax:  [phone]  Physical Therapy Treatment  Patient Details  Name: Haley Francis MRN: [id number] Date of Birth: Sep 06, 1955 Referring Provider (PT): Aundria Mems, MD   Encounter Date: 11/24/2019   PT End of Session - 11/24/19 1455    Visit Number 7    Number of Visits 13    Date for PT Re-Evaluation 12/13/19    Authorization Type Medicare & Medicaid    PT Start Time 1401    PT Stop Time 1441    PT Time Calculation (min) 40 min    Activity Tolerance Patient tolerated treatment well;Patient limited by pain    Behavior During Therapy [facility] for tasks assessed/performed           History reviewed. No pertinent past medical history.  History reviewed. No pertinent surgical history.  There were no vitals filed for this visit.   Subjective Assessment - 11/24/19 1402    Subjective Feels like her shoulder is getting a little better.    Patient is accompained by: Family member;Interpreter   daughter   Pertinent History pre-diabetes, malignant neoplasm of exocervix    Diagnostic tests 10/12/19 B shoulder xrays: negative    Patient Stated Goals decrease pain    Currently in Pain? Yes    Pain Score 5     Pain Location Shoulder    Pain Orientation Right    Pain Descriptors / Indicators Constant    Pain Type Chronic pain              [facility] PT Assessment - 11/24/19 0001      AROM   Right Shoulder Flexion 146 Degrees    Right Shoulder ABduction 177 Degrees    Right Shoulder Internal Rotation --   FIR L5; pain   Right Shoulder External Rotation --   FER T3   Left Shoulder Flexion 149 Degrees    Left Shoulder ABduction 155 Degrees    Left Shoulder Internal Rotation --   FIR T12   Left Shoulder External Rotation --   FER T4     PROM   Right Shoulder Flexion 162 Degrees   pain   Right Shoulder ABduction 165 Degrees   pain    Right Shoulder Internal Rotation 80 Degrees   pain   Right Shoulder External Rotation 45 Degrees   pain   Left Shoulder Flexion 162 Degrees   pain   Left Shoulder ABduction 172 Degrees    Left Shoulder Internal Rotation 90 Degrees    Left Shoulder External Rotation 62 Degrees                         [facility] Adult PT Treatment/Exercise - 11/24/19 0001      Shoulder Exercises: Standing   Row 10 reps;Both;Theraband;Strengthening    Theraband Level (Shoulder Row) Level 2 (Red)    Row Limitations scapular retraction    heavy cues for proper form     Shoulder Exercises: ROM/Strengthening   UBE (Upper Arm Bike) UBE: lvl 1.5, 3 min forwards, 3 min backwards     Cybex Row Limitations 10x 10# narrow grip   improved form; cues to depress R shoulder v   Other ROM/Strengthening Exercises R shoulder extension AAROM with wand x10    cues to depress R shoulder   Other ROM/Strengthening Exercises B shoulder IR with wand 10x to tolerance  Manual Therapy   Manual Therapy Passive ROM;Joint mobilization    Passive ROM R/L shoulder PROM all directions with gentle distraction                  PT Education - 11/24/19 1444    Education Details edu on objective progress and remaining impairments; edu on [place] eof step stool in the kitchen to avoid excessive shoulder hike    Person(s) Educated Patient;Child(ren)    Methods Explanation;Demonstration    Comprehension Verbalized understanding            PT Short Term Goals - 11/08/19 1452      PT SHORT TERM GOAL #1   Title Patient to be independent with initial HEP.    Time 3    Period Weeks    Status Achieved    Target Date 11/22/19             PT Long Term Goals - 11/08/19 1453      PT LONG TERM GOAL #1   Title Patient to be independent with advanced HEP.    Time 6    Period Weeks    Status On-going      PT LONG TERM GOAL #2   Title Patient to demonstrate B UE strength >/=4+/5.    Time 6    Period Weeks     Status On-going      PT LONG TERM GOAL #3   Title Patient to demonstrate B shoulder AROM/PROM WFL and without pain limiting.    Time 6    Period Weeks    Status On-going      PT LONG TERM GOAL #4   Title Patient to report tolerance for dressing without pain remaining.    Time 6    Period Weeks    Status On-going                 Plan - 11/24/19 1528    Clinical Impression Statement Patient without new complaints today. Noting small improvement in her shoulders since starting therapy. Patient tolerated B shoulder PROM in all planes, still with pain at end-ranges of motion. Demonstrated excellent improvement in shoulder PROM and AROM in nearly all planes of motion. Daughter noting that she feels as though the patient's pain levels have been unchanged, and stating that patient's pain meds have not been helping. Patient and daughter reporting continued pain at night, when cutting in the kitchen, and noting some rib pain with scapular rows. Reviewed rows with patient demonstrating poor form with difficulty correcting- better technique using machine row. Educated patent and daughter on use of step-stool when cutting vegetables to assist with leverage d/t patient's height. Both reported understanding and without complaints at end of session.    Rehab Potential Good    PT Frequency 2x / week    PT Duration 6 weeks    PT Treatment/Interventions ADLs/Self Care Home Management;Cryotherapy;Electrical Stimulation;Iontophoresis 4mg /ml Dexamethasone;Moist Heat;Therapeutic exercise;Therapeutic activities;Functional mobility training;Ultrasound;Neuromuscular re-education;Patient/family education;Manual techniques;Vasopneumatic Device;Taping;Energy conservation;Dry needling;Passive range of motion    PT Next Visit Plan progress B shoulder ROM and strength    Consulted and Agree with Plan of Care Patient;Family member/caregiver    Family Member Consulted daughterJanett Billow           Patient will  benefit from skilled therapeutic intervention in order to improve the following deficits and impairments:  Hypomobility, Increased edema, Pain, Impaired UE functional use, Increased fascial restricitons, Decreased strength, Decreased activity tolerance, Increased muscle spasms, Improper body mechanics, Decreased  range of motion, Impaired flexibility, Postural dysfunction  Visit Diagnosis: Chronic right shoulder pain  Stiffness of right shoulder, not elsewhere classified  Chronic left shoulder pain  Stiffness of left shoulder, not elsewhere classified  Muscle weakness (generalized)     Problem List Patient Active Problem List   Diagnosis Date Noted  . Bilateral shoulder pain 10/12/2019  . Facet hypertrophy of lumbar region 10/12/2018  . Cough 06/02/2017  . Rectal bleeding 06/02/2017  . Malignant neoplasm of exocervix ([place]) 03/25/2016  . Neoplasm of female genital organ 10/27/2014  . Abnormal Papanicolaou smear of cervix with positive human papilloma virus (HPV) test 10/27/2014  . Pre-diabetes 06/23/2013  . Polyp of colon, adenomatous 03/19/2013  . Abnormal mammogram 02/28/2013     Janene Harvey, PT, DPT 11/24/19 3:31 PM   [facility] [address]  [place], [place], [address] Phone: [phone]   Fax:  [phone]  Name: Breella Vanostrand MRN: [id number] Date of Birth: 05/26/1955

## 2019-11-24 NOTE — Telephone Encounter (Signed)
Left daughter a VM that new medication was sent to pharmacy.

## 2019-12-01 ENCOUNTER — Other Ambulatory Visit: Payer: Self-pay

## 2019-12-01 ENCOUNTER — Ambulatory Visit: Payer: Medicare Other

## 2019-12-01 DIAGNOSIS — G8929 Other chronic pain: Secondary | ICD-10-CM

## 2019-12-01 DIAGNOSIS — M25612 Stiffness of left shoulder, not elsewhere classified: Secondary | ICD-10-CM

## 2019-12-01 DIAGNOSIS — M6281 Muscle weakness (generalized): Secondary | ICD-10-CM

## 2019-12-01 DIAGNOSIS — M25511 Pain in right shoulder: Secondary | ICD-10-CM

## 2019-12-01 DIAGNOSIS — M25512 Pain in left shoulder: Secondary | ICD-10-CM

## 2019-12-01 DIAGNOSIS — M25611 Stiffness of right shoulder, not elsewhere classified: Secondary | ICD-10-CM

## 2019-12-01 NOTE — Therapy (Signed)
[facility] [address]  [place], [place], [address] Phone: [phone]   Fax:  [phone]  Physical Therapy Treatment  Patient Details  Name: Haley Francis MRN: [id number] Date of Birth: 1955-05-10 Referring Provider (PT): Aundria Mems, MD   Encounter Date: 12/01/2019   PT End of Session - 12/01/19 1115    Visit Number 8    Number of Visits 13    Date for PT Re-Evaluation 12/13/19    Authorization Type Medicare & Medicaid    PT Start Time 1107   pt. arrived late   PT Stop Time 1150    PT Time Calculation (min) 43 min    Activity Tolerance Patient tolerated treatment well;Patient limited by pain    Behavior During Therapy [facility] for tasks assessed/performed           No past medical history on file.  No past surgical history on file.  There were no vitals filed for this visit.   Subjective Assessment - 12/01/19 1114    Subjective Dr. Darene Lamer. prescribed her muscle relaxer which has helped.  Feels her ROM is improving behind her back reaching.    Patient is accompained by: Family member;Interpreter   [name]   Pertinent History pre-diabetes, malignant neoplasm of exocervix    Diagnostic tests 10/12/19 B shoulder xrays: negative    Patient Stated Goals decrease pain    Currently in Pain? No/denies    Pain Score 0-No pain    Pain Location Shoulder    Pain Orientation Right                             [facility] Adult PT Treatment/Exercise - 12/01/19 0001      Shoulder Exercises: Supine   Protraction Both;20 reps;Strengthening;Weights    Protraction Weight (lbs) 1    External Rotation Both;10 reps;Theraband    Theraband Level (Shoulder External Rotation) Level 1 (Yellow)    Other Supine Exercises B shoulder horizontal abduction with red TB x 15 hooklying on 6" foam     Other Supine Exercises r shoulder rhythmic stabilization with shoulder in 90 dg positioning and therapist  perturbations 2 x 30 sec      Shoulder Exercises: Standing   Flexion Both    Shoulder Flexion Weight (lbs) 1    Flexion Limitations to tolerance     ABduction Both;10 reps;Weights;Strengthening    Shoulder ABduction Weight (lbs) 1    ABduction Limitations to tolerance       Shoulder Exercises: ROM/Strengthening   UBE (Upper Arm Bike) UBE: lvl 1.5, 3 min forwards, 3 min backwards                     PT Short Term Goals - 11/08/19 1452      PT SHORT TERM GOAL #1   Title Patient to be independent with initial HEP.    Time 3    Period Weeks    Status Achieved    Target Date 11/22/19             PT Long Term Goals - 11/08/19 1453      PT LONG TERM GOAL #1   Title Patient to be independent with advanced HEP.    Time 6    Period Weeks    Status On-going      PT LONG TERM GOAL #2   Title Patient to demonstrate B UE strength >/=4+/5.  Time 6    Period Weeks    Status On-going      PT LONG TERM GOAL #3   Title Patient to demonstrate B shoulder AROM/PROM WFL and without pain limiting.    Time 6    Period Weeks    Status On-going      PT LONG TERM GOAL #4   Title Patient to report tolerance for dressing without pain remaining.    Time 6    Period Weeks    Status On-going                 Plan - 12/01/19 1115    Clinical Impression Statement Pt. Progressing well with PT.  Notes improving ROM however still limited with behind back IR.  Was able to demonstrate improved ROM and strength with overhead lifting in session today without UT compensation.  Worked on progressive scapular/shoulder strengthening and rhythmic stabilization.  Ended visit pain free.    Rehab Potential Good    PT Frequency 2x / week    PT Duration 6 weeks    PT Treatment/Interventions ADLs/Self Care Home Management;Cryotherapy;Electrical Stimulation;Iontophoresis 4mg /ml Dexamethasone;Moist Heat;Therapeutic exercise;Therapeutic activities;Functional mobility  training;Ultrasound;Neuromuscular re-education;Patient/family education;Manual techniques;Vasopneumatic Device;Taping;Energy conservation;Dry needling;Passive range of motion    PT Next Visit Plan progress B shoulder ROM and strength    Consulted and Agree with Plan of Care Patient;Family member/caregiver    Family Member Consulted daughterJanett Billow           Patient will benefit from skilled therapeutic intervention in order to improve the following deficits and impairments:  Hypomobility, Increased edema, Pain, Impaired UE functional use, Increased fascial restricitons, Decreased strength, Decreased activity tolerance, Increased muscle spasms, Improper body mechanics, Decreased range of motion, Impaired flexibility, Postural dysfunction  Visit Diagnosis: Chronic right shoulder pain  Stiffness of right shoulder, not elsewhere classified  Chronic left shoulder pain  Stiffness of left shoulder, not elsewhere classified  Muscle weakness (generalized)     Problem List Patient Active Problem List   Diagnosis Date Noted  . Bilateral shoulder pain 10/12/2019  . Facet hypertrophy of lumbar region 10/12/2018  . Cough 06/02/2017  . Rectal bleeding 06/02/2017  . Malignant neoplasm of exocervix ([place]) 03/25/2016  . Neoplasm of female genital organ 10/27/2014  . Abnormal Papanicolaou smear of cervix with positive human papilloma virus (HPV) test 10/27/2014  . Pre-diabetes 06/23/2013  . Polyp of colon, adenomatous 03/19/2013  . Abnormal mammogram 02/28/2013    [name], PTA 12/01/19 12:02 PM   [facility] [address]  [place], [place], [address] Phone: [phone]   Fax:  [phone]  Name: Haley Francis MRN: [id number] Date of Birth: 09-20-1955

## 2019-12-03 ENCOUNTER — Other Ambulatory Visit: Payer: Self-pay

## 2019-12-03 ENCOUNTER — Encounter: Payer: Self-pay | Admitting: Physical Therapy

## 2019-12-03 ENCOUNTER — Ambulatory Visit: Payer: Medicare Other | Admitting: Physical Therapy

## 2019-12-03 DIAGNOSIS — M25511 Pain in right shoulder: Secondary | ICD-10-CM | POA: Diagnosis not present

## 2019-12-03 DIAGNOSIS — G8929 Other chronic pain: Secondary | ICD-10-CM

## 2019-12-03 DIAGNOSIS — M6281 Muscle weakness (generalized): Secondary | ICD-10-CM

## 2019-12-03 DIAGNOSIS — M25611 Stiffness of right shoulder, not elsewhere classified: Secondary | ICD-10-CM

## 2019-12-03 DIAGNOSIS — M25612 Stiffness of left shoulder, not elsewhere classified: Secondary | ICD-10-CM

## 2019-12-03 DIAGNOSIS — M25512 Pain in left shoulder: Secondary | ICD-10-CM

## 2019-12-03 NOTE — Therapy (Signed)
[facility] [address]  [place], [place], [address] Phone: [phone]   Fax:  [phone]  Physical Therapy Treatment  Patient Details  Name: Haley Francis MRN: [id number] Date of Birth: 1955/03/12 Referring Provider (PT): Aundria Mems, MD   Encounter Date: 12/03/2019   PT End of Session - 12/03/19 1150    Visit Number 9    Number of Visits 13    Date for PT Re-Evaluation 12/13/19    Authorization Type Medicare & Medicaid    PT Start Time 1106    PT Stop Time 1145    PT Time Calculation (min) 39 min    Activity Tolerance Patient tolerated treatment well    Behavior During Therapy [facility] for tasks assessed/performed           History reviewed. No pertinent past medical history.  History reviewed. No pertinent surgical history.  There were no vitals filed for this visit.   Subjective Assessment - 12/03/19 1108    Subjective Feeling much better with the medicine her MD prescribed. Still having a little bit of pain remaining when reaching backwards.    Patient is accompained by: Family member;Interpreter    Pertinent History pre-diabetes, malignant neoplasm of exocervix    Diagnostic tests 10/12/19 B shoulder xrays: negative    Patient Stated Goals decrease pain    Currently in Pain? No/denies                             [name] Adult PT Treatment/Exercise - 12/03/19 0001      Shoulder Exercises: Seated   Flexion Both;10 reps    Flexion Limitations sitting with wand    Abduction AAROM;Right;Left;10 reps    ABduction Limitations with wand to tolerance in sitting      Shoulder Exercises: Prone   Retraction Strengthening;Right;Left;15 reps;Weights    Retraction Weight (lbs) 3    Retraction Limitations prone row   manual assistance to depress shoulders   Extension Strengthening;Right;Left;10 reps;Weights    Extension Weight (lbs) 2    Extension Limitations good form      Shoulder  Exercises: Standing   External Rotation Strengthening;Right;Left;10 reps;Theraband    External Rotation Limitations towel roll under elbow   heavy cues for form   Internal Rotation Strengthening;Right;Left;10 reps;Theraband    Internal Rotation Limitations towel roll under elbow      Shoulder Exercises: ROM/Strengthening   UBE (Upper Arm Bike) UBE: lvl 1.5, 3 min forwards, 3 min backwards       Shoulder Exercises: Stretch   Other Shoulder Stretches R and L shoulder IR apley stretch with strap 5x3" each side   minimal pain   Other Shoulder Stretches R shoulder IR stretch with wand behind back 5x, 5x in R and L diagonal                   PT Education - 12/03/19 1149    Education Details update to HEP    Person(s) Educated Patient    Methods Explanation;Demonstration;Tactile cues;Verbal cues;Handout    Comprehension Returned demonstration;Verbalized understanding            PT Short Term Goals - 11/08/19 1452      PT SHORT TERM GOAL #1   Title Patient to be independent with initial HEP.    Time 3    Period Weeks    Status Achieved    Target Date 11/22/19  PT Long Term Goals - 11/08/19 1453      PT LONG TERM GOAL #1   Title Patient to be independent with advanced HEP.    Time 6    Period Weeks    Status On-going      PT LONG TERM GOAL #2   Title Patient to demonstrate B UE strength >/=4+/5.    Time 6    Period Weeks    Status On-going      PT LONG TERM GOAL #3   Title Patient to demonstrate B shoulder AROM/PROM WFL and without pain limiting.    Time 6    Period Weeks    Status On-going      PT LONG TERM GOAL #4   Title Patient to report tolerance for dressing without pain remaining.    Time 6    Period Weeks    Status On-going                 Plan - 12/03/19 1150    Clinical Impression Statement Patient reporting improvement in pain with recent addition of pain medication. Daughter notes that the patient is still having a  little bit of pain remaining when reaching backwards. Worked on shoulder AAROM with patient demonstrating good ROM and minimal compensations. Initiated RTC strengthening with most challenge with R ER. Patient needing heavy cueing for proper form, thus will require re-assessment for form. Able to tolerate more intense IR stretching today with minimal pain. Updated HEP- patient reported understanding and without pain at end of session.    Rehab Potential Good    PT Frequency 2x / week    PT Duration 6 weeks    PT Treatment/Interventions ADLs/Self Care Home Management;Cryotherapy;Electrical Stimulation;Iontophoresis 4mg /ml Dexamethasone;Moist Heat;Therapeutic exercise;Therapeutic activities;Functional mobility training;Ultrasound;Neuromuscular re-education;Patient/family education;Manual techniques;Vasopneumatic Device;Taping;Energy conservation;Dry needling;Passive range of motion    PT Next Visit Plan progress B shoulder ROM and strength    Consulted and Agree with Plan of Care Patient;Family member/caregiver    Family Member Consulted daughterJanett Francis           Patient will benefit from skilled therapeutic intervention in order to improve the following deficits and impairments:  Hypomobility, Increased edema, Pain, Impaired UE functional use, Increased fascial restricitons, Decreased strength, Decreased activity tolerance, Increased muscle spasms, Improper body mechanics, Decreased range of motion, Impaired flexibility, Postural dysfunction  Visit Diagnosis: Chronic right shoulder pain  Stiffness of right shoulder, not elsewhere classified  Chronic left shoulder pain  Stiffness of left shoulder, not elsewhere classified  Muscle weakness (generalized)     Problem List Patient Active Problem List   Diagnosis Date Noted  . Bilateral shoulder pain 10/12/2019  . Facet hypertrophy of lumbar region 10/12/2018  . Cough 06/02/2017  . Rectal bleeding 06/02/2017  . Malignant neoplasm of  exocervix ([place]) 03/25/2016  . Neoplasm of female genital organ 10/27/2014  . Abnormal Papanicolaou smear of cervix with positive human papilloma virus (HPV) test 10/27/2014  . Pre-diabetes 06/23/2013  . Polyp of colon, adenomatous 03/19/2013  . Abnormal mammogram 02/28/2013     Janene Harvey, PT, DPT 12/03/19 11:57 AM   [facility] [address]  [place], [place], [address] Phone: [phone]   Fax:  [phone]  Name: Jayona Mccaig MRN: [id number] Date of Birth: 19-Oct-1955

## 2019-12-07 ENCOUNTER — Encounter: Payer: Self-pay | Admitting: Physical Therapy

## 2019-12-07 ENCOUNTER — Other Ambulatory Visit: Payer: Self-pay

## 2019-12-07 ENCOUNTER — Ambulatory Visit: Payer: Medicare Other | Admitting: Physical Therapy

## 2019-12-07 DIAGNOSIS — M25611 Stiffness of right shoulder, not elsewhere classified: Secondary | ICD-10-CM

## 2019-12-07 DIAGNOSIS — G8929 Other chronic pain: Secondary | ICD-10-CM

## 2019-12-07 DIAGNOSIS — M25511 Pain in right shoulder: Secondary | ICD-10-CM | POA: Diagnosis not present

## 2019-12-07 DIAGNOSIS — M25512 Pain in left shoulder: Secondary | ICD-10-CM

## 2019-12-07 DIAGNOSIS — M25612 Stiffness of left shoulder, not elsewhere classified: Secondary | ICD-10-CM

## 2019-12-07 DIAGNOSIS — M6281 Muscle weakness (generalized): Secondary | ICD-10-CM

## 2019-12-07 NOTE — Therapy (Addendum)
[facility] [address]  [place], [place], [address] Phone: [phone]   Fax:  [phone]  Physical Therapy Progress Note  Patient Details  Name: Keyerra Lamere MRN: [id number] Date of Birth: 28-Jun-1955 Referring Provider (PT): Aundria Mems, MD   Progress Note Reporting Period 11/01/19 to 12/07/19  See note below for Objective Data and Assessment of Progress/Goals.     Encounter Date: 12/07/2019   PT End of Session - 12/07/19 1357    Visit Number 10    Number of Visits 13    Date for PT Re-Evaluation 12/13/19    Authorization Type Medicare & Medicaid    PT Start Time 3704    PT Stop Time 1355    PT Time Calculation (min) 42 min    Activity Tolerance Patient tolerated treatment well;Patient limited by pain    Behavior During Therapy [facility] for tasks assessed/performed           History reviewed. No pertinent past medical history.  History reviewed. No pertinent surgical history.  There were no vitals filed for this visit.   Subjective Assessment - 12/07/19 1315    Subjective Feeling good today. Feels like she shoulders are doing a lot better- still having a little bit of pain when reaching behind her.    Patient is accompained by: Family member;Interpreter    Pertinent History pre-diabetes, malignant neoplasm of exocervix    Diagnostic tests 10/12/19 B shoulder xrays: negative    Patient Stated Goals decrease pain    Currently in Pain? No/denies              [facility] PT Assessment - 12/07/19 0001      Assessment   Medical Diagnosis Chronic pain of B shoulders    Referring Provider (PT) Aundria Mems, MD    Onset Date/Surgical Date 05/04/19      AROM   Right Shoulder Flexion 144 Degrees    Right Shoulder ABduction 168 Degrees    Right Shoulder Internal Rotation --   FIR T11   Right Shoulder External Rotation --   FER T3   Left Shoulder Flexion 146 Degrees    Left Shoulder  ABduction 161 Degrees    Left Shoulder Internal Rotation --   FIR T11   Left Shoulder External Rotation --   FER T4     PROM   Right Shoulder Flexion 162 Degrees    Right Shoulder ABduction 176 Degrees    Right Shoulder Internal Rotation 73 Degrees    Right Shoulder External Rotation 42 Degrees   pain   Left Shoulder Flexion 169 Degrees   pain   Left Shoulder ABduction 178 Degrees    Left Shoulder Internal Rotation 86 Degrees    Left Shoulder External Rotation 56 Degrees   pain     Strength   Right Shoulder Flexion 4+/5    Right Shoulder ABduction 4+/5    Right Shoulder Internal Rotation 4+/5    Right Shoulder External Rotation 4+/5    Left Shoulder Flexion 4+/5    Left Shoulder ABduction 4+/5    Left Shoulder Internal Rotation 4+/5    Left Shoulder External Rotation 4+/5                         [facility] Adult PT Treatment/Exercise - 12/07/19 0001      Shoulder Exercises: Standing   Other Standing Exercises green TB rows 10x      Shoulder  Exercises: ROM/Strengthening   UBE (Upper Arm Bike) UBE: lvl 1.5, 3 min forwards, 3 min backwards       Manual Therapy   Manual Therapy Passive ROM    Manual therapy comments supine    Passive ROM R/L shoulder PROM all directions with gentle distraction                  PT Education - 12/07/19 1356    Education Details update/consolidation to HEP; discussion on objective progress and remaining impairments    Person(s) Educated Patient    Methods Explanation;Demonstration;Tactile cues;Handout;Verbal cues    Comprehension Verbalized understanding;Returned demonstration            PT Short Term Goals - 11/08/19 1452      PT SHORT TERM GOAL #1   Title Patient to be independent with initial HEP.    Time 3    Period Weeks    Status Achieved    Target Date 11/22/19             PT Long Term Goals - 12/07/19 1402      PT LONG TERM GOAL #1   Title Patient to be independent with advanced HEP.    Time 6     Period Weeks    Status Achieved      PT LONG TERM GOAL #2   Title Patient to demonstrate B UE strength >/=4+/5.    Time 6    Period Weeks    Status Achieved      PT LONG TERM GOAL #3   Title Patient to demonstrate B shoulder AROM/PROM WFL and without pain limiting.    Time 6    Period Weeks    Status Partially Met   mild pain remaining at end ranges of motion     PT LONG TERM GOAL #4   Title Patient to report tolerance for dressing without pain remaining.    Time 6    Period Weeks    Status Partially Met   very mild pain remaining when putting on a jacket                Plan - 12/07/19 1357    Clinical Impression Statement Patient reporting considerable improvement in B shoulders since initial eval. Still noting mild pain remaining when reaching behind the back. Patient has demonstrated improvement in B shoulder abduction and L shoulder flexion PROM. Shoulder IR/ER very slightly more limited and painful today. Shoulder AROM has improved in B shoulder IR and L shoulder abduction. Patient now has functional shoulder ROM at this time. Strength goal was also met today. Patient notes that she was previously unable to don a jacket, now able to perform this independently with mild pain remaining, if any. Updated HEP with ther-ex to promote ROM and periscapular strengthening maintenance program. Patient and daughter agreeable to 3 day hold at this time d/t satisfaction with CLOF.    Rehab Potential Good    PT Frequency 2x / week    PT Duration 6 weeks    PT Treatment/Interventions ADLs/Self Care Home Management;Cryotherapy;Electrical Stimulation;Iontophoresis 55m/ml Dexamethasone;Moist Heat;Therapeutic exercise;Therapeutic activities;Functional mobility training;Ultrasound;Neuromuscular re-education;Patient/family education;Manual techniques;Vasopneumatic Device;Taping;Energy conservation;Dry needling;Passive range of motion    PT Next Visit Plan 30 day hold at this time    Consulted and  Agree with Plan of Care Patient;Family member/caregiver    Family Member Consulted daughter-Janett Billow          Patient will benefit from skilled therapeutic intervention in order to  improve the following deficits and impairments:  Hypomobility, Increased edema, Pain, Impaired UE functional use, Increased fascial restricitons, Decreased strength, Decreased activity tolerance, Increased muscle spasms, Improper body mechanics, Decreased range of motion, Impaired flexibility, Postural dysfunction  Visit Diagnosis: Chronic right shoulder pain  Stiffness of right shoulder, not elsewhere classified  Chronic left shoulder pain  Stiffness of left shoulder, not elsewhere classified  Muscle weakness (generalized)     Problem List Patient Active Problem List   Diagnosis Date Noted  . Bilateral shoulder pain 10/12/2019  . Facet hypertrophy of lumbar region 10/12/2018  . Cough 06/02/2017  . Rectal bleeding 06/02/2017  . Malignant neoplasm of exocervix ([place]) 03/25/2016  . Neoplasm of female genital organ 10/27/2014  . Abnormal Papanicolaou smear of cervix with positive human papilloma virus (HPV) test 10/27/2014  . Pre-diabetes 06/23/2013  . Polyp of colon, adenomatous 03/19/2013  . Abnormal mammogram 02/28/2013     Janene Harvey, PT, DPT 12/07/19 2:04 PM   [facility] [address]  [place], [place], [address] Phone: [phone]   Fax:  [phone]  Name: Lisandra Mathisen MRN: [id number] Date of Birth: 1955/08/04   PHYSICAL THERAPY DISCHARGE SUMMARY  Visits from Start of Care: 10  Current functional level related to goals / functional outcomes: See above clinical impression; patient did not return during 30 day hold   Remaining deficits: Mild pain donning jacket   Education / Equipment: HEP  Plan: Patient agrees to discharge.  Patient goals were partially met. Patient is being discharged due to meeting  the stated rehab goals.  ?????     Janene Harvey, PT, DPT 01/17/20 9:14 AM

## 2019-12-13 ENCOUNTER — Ambulatory Visit: Payer: Medicare Other | Admitting: Physical Therapy

## 2020-06-06 ENCOUNTER — Encounter: Payer: Self-pay | Admitting: Physician Assistant

## 2020-06-08 MED ORDER — BENZONATATE 200 MG PO CAPS: 200.0000 mg | ORAL_CAPSULE | Freq: Two times a day (BID) | ORAL | 0 refills | Status: DC | PRN

## 2020-08-01 ENCOUNTER — Ambulatory Visit: Payer: Medicare Other

## 2020-08-09 ENCOUNTER — Ambulatory Visit: Payer: Medicare Other

## 2020-08-14 ENCOUNTER — Other Ambulatory Visit: Payer: Self-pay | Admitting: Physician Assistant

## 2020-08-14 ENCOUNTER — Other Ambulatory Visit: Payer: Self-pay

## 2020-08-14 ENCOUNTER — Ambulatory Visit (INDEPENDENT_AMBULATORY_CARE_PROVIDER_SITE_OTHER): Payer: Medicare Other | Admitting: Physician Assistant

## 2020-08-14 VITALS — BP 119/77 | HR 72 | Ht 59.0 in | Wt 102.1 lb

## 2020-08-14 DIAGNOSIS — Z1231 Encounter for screening mammogram for malignant neoplasm of breast: Secondary | ICD-10-CM | POA: Diagnosis not present

## 2020-08-14 DIAGNOSIS — Z0000 Encounter for general adult medical examination without abnormal findings: Secondary | ICD-10-CM | POA: Diagnosis not present

## 2020-08-14 DIAGNOSIS — Z780 Asymptomatic menopausal state: Secondary | ICD-10-CM | POA: Diagnosis not present

## 2020-08-14 MED ORDER — BENZONATATE 200 MG PO CAPS: 200.0000 mg | ORAL_CAPSULE | Freq: Two times a day (BID) | ORAL | 0 refills | Status: DC | PRN

## 2020-08-14 NOTE — Progress Notes (Signed)
MEDICARE ANNUAL WELLNESS VISIT  08/14/2020  Subjective:  Haley Francis is a 65 y.o. female patient of Alden Hipp, Royetta Car, PA-C who had a [employer] Visit today. Haley Francis is Retired and lives alone. she has 1 child. she reports that she is socially active and does interact with friends/family regularly. she is minimally physically active and enjoys gardening and watching her phone.  Patient Care Team: Lavada Mesi as PCP - General (Family Medicine)  Advanced Directives 08/14/2020 11/01/2019 11/16/2018  Does Patient Have a Medical Advance Directive? Yes No No  Type of Advance Directive Living will;Healthcare Power of Attorney - -  Does patient want to make changes to medical advance directive? No - Patient declined - -  Copy of [place] in Chart? No - copy requested - -  Would patient like information on creating a medical advance directive? - No - Patient declined No - Patient declined    Hospital Utilization Over the Past 12 Months: # of hospitalizations or ER visits: 0 # of surgeries: 0  Review of Systems    Patient reports that her overall health is better when compared to last year.  Review of Systems: History obtained from chart review and the patient  All other systems negative.  Pain Assessment Pain : No/denies pain     Current Medications & Allergies (verified) Allergies as of 08/14/2020   No Known Allergies      Medication List        Accurate as of August 14, 2020 11:37 AM. If you have any questions, ask your nurse or doctor.          acetaminophen 500 MG tablet Commonly known as: TYLENOL Take 500 mg by mouth as needed.   benzonatate 200 MG capsule Commonly known as: TESSALON Take 1 capsule (200 mg total) by mouth 2 (two) times daily as needed for cough.   calcium carbonate 500 MG chewable tablet Commonly known as: TUMS - dosed in mg elemental calcium Chew 1 tablet by mouth as needed for indigestion or heartburn.    ibuprofen 800 MG tablet Commonly known as: ADVIL Take 1 tablet (800 mg total) by mouth every 8 (eight) hours as needed.   omeprazole 20 MG capsule Commonly known as: PRILOSEC Take 20 mg by mouth as needed.   traMADol 50 MG tablet Commonly known as: ULTRAM Take 1-2 tablets (50-100 mg total) by mouth every 8 (eight) hours as needed for moderate pain. Maximum 6 tabs per day.        History (reviewed): History reviewed. No pertinent past medical history. History reviewed. No pertinent surgical history. Family History  Problem Relation Age of Onset   Hypertension Mother    Diabetes Mother    Hyperlipidemia Mother    Social History   Socioeconomic History   Marital status: Legally Separated    Spouse name: Haley Francis   Number of children: 1   Years of education: HA   Highest education level: High school graduate  Occupational History   Occupation: Manicurist    Comment: Fish farm manager   Occupation: Retired.  Tobacco Use   Smoking status: Never   Smokeless tobacco: Never  Vaping Use   Vaping Use: Never used  Substance and Sexual Activity   Alcohol use: No    Alcohol/week: 1.0 standard drink    Types: 1 Cans of beer per week   Drug use: No   Sexual activity: Not on file  Other Topics Concern   Not on  file  Social History Narrative   Lives alone. She has 1 child. She enjoys watching her phone and gardening.   Social Determinants of Health   Financial Resource Strain: Low Risk    Difficulty of Paying Living Expenses: Not hard at all  Food Insecurity: No Food Insecurity   Worried About Charity fundraiser in the Last Year: Never true   [place] in the Last Year: Never true  Transportation Needs: No Transportation Needs   Lack of Transportation (Medical): No   Lack of Transportation (Non-Medical): No  Physical Activity: Inactive   Days of Exercise per Week: 0 days   Minutes of Exercise per Session: 0 min  Stress: No Stress Concern Present   Feeling of  Stress : Not at all  Social Connections: Moderately Isolated   Frequency of Communication with Friends and Family: More than three times a week   Frequency of Social Gatherings with Friends and Family: More than three times a week   Attends Religious Services: More than 4 times per year   Active Member of [employer] or Organizations: No   Attends Archivist Meetings: Never   Marital Status: Separated    Activities of Daily Living In your present state of health, do you have any difficulty performing the following activities: 08/14/2020  Hearing? N  Vision? N  Difficulty concentrating or making decisions? N  Walking or climbing stairs? N  Dressing or bathing? N  Doing errands, shopping? N  Preparing Food and eating ? N  Using the Toilet? N  In the past six months, have you accidently leaked urine? N  Do you have problems with loss of bowel control? N  Managing your Medications? N  Managing your Finances? N  Housekeeping or managing your Housekeeping? N  Some recent data might be hidden    Patient Education/Literacy How often do you need to have someone help you when you read instructions, pamphlets, or other written materials from your doctor or pharmacy?: 4 - Often (Her daughter helps her with [place] but she is fluent in [place]) What is the last grade level you completed in school?: 12th grade.  Exercise Current Exercise Habits: Home exercise routine, Type of exercise: stretching, Time (Minutes): 15, Frequency (Times/Week): >7, Weekly Exercise (Minutes/Week): 0, Intensity: Mild, Exercise limited by: None identified  Diet Patient reports consuming 2 meals a day and 2-3 snack(s) a day Patient reports that her primary diet is: Regular Patient reports that she does have regular access to food.   Depression Screen PHQ 2/9 Scores 08/14/2020 06/02/2017 02/19/2013  PHQ - 2 Score 0 2 0  PHQ- 9 Score - 2 -     Fall Risk Fall Risk  08/14/2020 02/19/2013  Falls in the past year? 0  No  Number falls in past yr: 0 -  Injury with Fall? 0 -  Risk for fall due to : No Fall Risks -  Follow up Falls evaluation completed -     Objective:   BP 119/77 (BP Location: Right Arm, Patient Position: Sitting, Cuff Size: Normal)   Pulse 72   Ht '4\' 11"'$  (1.499 m)   Wt 102 lb 1.9 oz (46.3 kg)   SpO2 100%   BMI 20.63 kg/m   Last Weight  Most recent update: 08/14/2020 11:16 AM    Weight  46.3 kg (102 lb 1.9 oz)             Body mass index is 20.63 kg/m.  Hearing/Vision  Haley Francis did not have difficulty with hearing/understanding during the face-to-face interview Haley Francis did not have difficulty with her vision during the face-to-face interview Reports that she has not had a formal eye exam by an eye care professional within the past year Reports that she has not had a formal hearing evaluation within the past year  Cognitive Function: 6CIT Screen 08/14/2020  What Year? 0 points  What month? 3 points  What time? 0 points  Count back from 20 0 points  Months in reverse 0 points  Repeat phrase 0 points  Total Score 3    Normal Cognitive Function Screening: Yes (Normal:0-7, Significant for Dysfunction: >8)  Immunization & Health Maintenance Record Immunization History  Administered Date(s) Administered   Influenza Split 10/15/2010   Janssen (J&J) SARS-COV-2 Vaccination 03/28/2019, 12/01/2019   Tdap 02/17/2013    Health Maintenance  Topic Date Due   COVID-19 Vaccine (3 - Janssen risk series) 08/30/2020 (Originally 01/26/2020)   INFLUENZA VACCINE  09/30/2020 (Originally 08/14/2020)   Zoster Vaccines- Shingrix (1 of 2) 11/14/2020 (Originally 06/08/1973)   MAMMOGRAM  08/14/2021 (Originally 10/21/2019)   DEXA SCAN  08/14/2021 (Originally 06/09/2019)   PNA vac Low Risk Adult (1 of 2 - PCV13) 08/14/2021 (Originally 06/09/2019)   COLONOSCOPY (Pts 45-27yr Insurance coverage will need to be confirmed)  09/11/2022   TETANUS/TDAP  02/18/2023   Hepatitis C Screening  Completed   HPV  VACCINES  Aged Out       Assessment  This is a routine wellness examination for CHenry Schein  Health Maintenance: Due or Overdue There are no preventive care reminders to display for this patient.   CKambra Rojodoes not need a referral for Community Assistance: Care Management:   no Social Work:    no Prescription Assistance:  no Nutrition/Diabetes Education:  no   Plan:  Personalized Goals  Goals Addressed               This Visit's Progress     Patient Stated (pt-stated)        08/14/2020 AWV Goal: Exercise for General Health  Patient will verbalize understanding of the benefits of increased physical activity: Exercising regularly is important. It will improve your overall fitness, flexibility, and endurance. Regular exercise also will improve your overall health. It can help you control your weight, reduce stress, and improve your bone density. Over the next year, patient will increase physical activity as tolerated with a goal of at least 150 minutes of moderate physical activity per week.  You can tell that you are exercising at a moderate intensity if your heart starts beating faster and you start breathing faster but can still hold a conversation. Moderate-intensity exercise ideas include: Walking 1 mile (1.6 km) in about 15 minutes Biking Hiking Golfing Dancing Water aerobics Patient will verbalize understanding of everyday activities that increase physical activity by providing examples like the following: Yard work, such as: PSales promotion account executiveGardening Washing windows or floors Patient will be able to explain general safety guidelines for exercising:  Before you start a new exercise program, talk with your health care provider. Do not exercise so much that you hurt yourself, feel dizzy, or get very short of breath. Wear comfortable clothes and wear shoes with good  support. Drink plenty of water while you exercise to prevent dehydration or heat stroke. Work out until your breathing and your heartbeat get faster.  Personalized Health Maintenance & Screening Recommendations  Pneumococcal vaccine  Influenza vaccine Screening mammography Bone densitometry screening Shingles vaccine  Lung Cancer Screening Recommended: no (Low Dose CT Chest recommended if Age 49-80 years, 30 pack-year currently smoking OR have quit w/in past 15 years) Hepatitis C Screening recommended: no HIV Screening recommended: no  Advanced Directives: Written information was not given per the patient's request.  Referrals & Orders Orders Placed This Encounter  Procedures   Mammogram 3D [place]    Follow-up Plan Follow-up with Donella Stade, PA-C as planned Schedule your shingles vaccine at your pharmacy. Let us know if you change your mind about the pneumonia vaccine.  Bone density and mammogram referral has been sent and they will call you to schedule.  Medicare wellness visit in one year.   I have personally reviewed and noted the following in the patient's chart:   Medical and social history Use of alcohol, tobacco or illicit drugs  Current medications and supplements Functional ability and status Nutritional status Physical activity Advanced directives List of other physicians Hospitalizations, surgeries, and ER visits in previous 12 months Vitals Screenings to include cognitive, depression, and falls Referrals and appointments  In addition, I have reviewed and discussed with patient certain preventive protocols, quality metrics, and best practice recommendations. A written personalized care plan for preventive services as well as general preventive health recommendations were provided to patient.     Tinnie Gens, RN  08/14/2020

## 2020-08-14 NOTE — Patient Instructions (Addendum)
[place] Maintenance Summary and Written Plan of Care  Ms. Korman ,  Thank you for allowing me to perform your Medicare Annual Wellness Visit and for your ongoing commitment to your health.   Health Maintenance & Immunization History Health Maintenance  Topic Date Due   COVID-19 Vaccine (3 - Janssen risk series) 08/30/2020 (Originally 01/26/2020)   INFLUENZA VACCINE  09/30/2020 (Originally 08/14/2020)   Zoster Vaccines- Shingrix (1 of 2) 11/14/2020 (Originally 06/08/1973)   MAMMOGRAM  08/14/2021 (Originally 10/21/2019)   DEXA SCAN  08/14/2021 (Originally 06/09/2019)   PNA vac Low Risk Adult (1 of 2 - PCV13) 08/14/2021 (Originally 06/09/2019)   COLONOSCOPY (Pts 45-11yr Insurance coverage will need to be confirmed)  09/11/2022   TETANUS/TDAP  02/18/2023   Hepatitis C Screening  Completed   HPV VACCINES  Aged Out   Immunization History  Administered Date(s) Administered   Influenza Split 10/15/2010   Janssen (J&J) SARS-COV-2 Vaccination 03/28/2019, 12/01/2019   Tdap 02/17/2013    These are the patient goals that we discussed:  Goals Addressed               This Visit's Progress     Patient Stated (pt-stated)        08/14/2020 AWV Goal: Exercise for General Health  Patient will verbalize understanding of the benefits of increased physical activity: Exercising regularly is important. It will improve your overall fitness, flexibility, and endurance. Regular exercise also will improve your overall health. It can help you control your weight, reduce stress, and improve your bone density. Over the next year, patient will increase physical activity as tolerated with a goal of at least 150 minutes of moderate physical activity per week.  You can tell that you are exercising at a moderate intensity if your heart starts beating faster and you start breathing faster but can still hold a conversation. Moderate-intensity exercise ideas include: Walking 1 mile  (1.6 km) in about 15 minutes Biking Hiking Golfing Dancing Water aerobics Patient will verbalize understanding of everyday activities that increase physical activity by providing examples like the following: Yard work, such as: PSales promotion account executiveGardening Washing windows or floors Patient will be able to explain general safety guidelines for exercising:  Before you start a new exercise program, talk with your health care provider. Do not exercise so much that you hurt yourself, feel dizzy, or get very short of breath. Wear comfortable clothes and wear shoes with good support. Drink plenty of water while you exercise to prevent dehydration or heat stroke. Work out until your breathing and your heartbeat get faster.           This is a list of Health Maintenance Items that are overdue or due now: Pneumococcal vaccine  Influenza vaccine Screening mammography Bone densitometry screening Shingles vaccine    Orders/Referrals Placed Today: Orders Placed This Encounter  Procedures   Mammogram 3D SCREEN BREAST BILATERAL    Standing Status:   Future    Standing Expiration Date:   08/14/2021    Scheduling Instructions:     Please call patient to schedule.    Order Specific Question:   Reason for Exam (SYMPTOM  OR DIAGNOSIS REQUIRED)    Answer:   Breast cancer screening    Order Specific Question:   Preferred imaging location?    Answer[facility]    Standing Status:   Future  Standing Expiration Date:   08/14/2021    Scheduling Instructions:     Please call patient to schedule.    Order Specific Question:   Reason for exam:    Answer:   Osteoporosis screening    Order Specific Question:   Preferred imaging location?    Answer:   [facility]    (Contact our referral department at [phone] if you have not spoken with someone about your referral  appointment within the next 5 days)    Follow-up Plan Follow-up with Donella Stade, PA-C as planned Schedule your shingles vaccine at your pharmacy. Let us know if you change your mind about the pneumonia vaccine.  Bone density and mammogram referral has been sent and they will call you to schedule.  Medicare wellness visit in one year.

## 2020-10-15 ENCOUNTER — Other Ambulatory Visit: Payer: Self-pay | Admitting: Physician Assistant

## 2020-10-19 ENCOUNTER — Other Ambulatory Visit: Payer: Self-pay | Admitting: Physician Assistant

## 2020-10-20 MED ORDER — BENZONATATE 200 MG PO CAPS: 200.0000 mg | ORAL_CAPSULE | Freq: Two times a day (BID) | ORAL | 0 refills | Status: DC | PRN

## 2021-01-27 ENCOUNTER — Encounter: Payer: Self-pay | Admitting: Physician Assistant

## 2021-02-07 ENCOUNTER — Encounter: Payer: Self-pay | Admitting: Physician Assistant

## 2021-02-07 ENCOUNTER — Other Ambulatory Visit: Payer: Self-pay

## 2021-02-07 ENCOUNTER — Ambulatory Visit (INDEPENDENT_AMBULATORY_CARE_PROVIDER_SITE_OTHER): Payer: Medicare Other | Admitting: Physician Assistant

## 2021-02-07 VITALS — BP 114/62 | HR 85 | Ht 59.0 in | Wt 103.0 lb

## 2021-02-07 DIAGNOSIS — Z13820 Encounter for screening for osteoporosis: Secondary | ICD-10-CM

## 2021-02-07 DIAGNOSIS — Z1231 Encounter for screening mammogram for malignant neoplasm of breast: Secondary | ICD-10-CM

## 2021-02-07 DIAGNOSIS — D649 Anemia, unspecified: Secondary | ICD-10-CM | POA: Diagnosis not present

## 2021-02-07 DIAGNOSIS — M791 Myalgia, unspecified site: Secondary | ICD-10-CM

## 2021-02-07 DIAGNOSIS — Z9221 Personal history of antineoplastic chemotherapy: Secondary | ICD-10-CM | POA: Diagnosis not present

## 2021-02-07 DIAGNOSIS — M2550 Pain in unspecified joint: Secondary | ICD-10-CM | POA: Diagnosis not present

## 2021-02-07 DIAGNOSIS — R1013 Epigastric pain: Secondary | ICD-10-CM

## 2021-02-07 DIAGNOSIS — E559 Vitamin D deficiency, unspecified: Secondary | ICD-10-CM | POA: Diagnosis not present

## 2021-02-07 DIAGNOSIS — M5416 Radiculopathy, lumbar region: Secondary | ICD-10-CM | POA: Diagnosis not present

## 2021-02-07 DIAGNOSIS — G629 Polyneuropathy, unspecified: Secondary | ICD-10-CM | POA: Diagnosis not present

## 2021-02-07 DIAGNOSIS — E538 Deficiency of other specified B group vitamins: Secondary | ICD-10-CM | POA: Diagnosis not present

## 2021-02-07 MED ORDER — OMEPRAZOLE 40 MG PO CPDR: 40.0000 mg | DELAYED_RELEASE_CAPSULE | Freq: Every day | ORAL | 5 refills | Status: DC

## 2021-02-07 MED ORDER — FOLTANX 3-35-2 MG PO TABS: 1.0000 | ORAL_TABLET | Freq: Two times a day (BID) | ORAL | 11 refills | Status: DC

## 2021-02-07 NOTE — Patient Instructions (Addendum)
Turmeric 500mg  twice a day.  Foltanx vitamin  Restart omeprazole in the morning daily.  Peripheral Neuropathy Peripheral neuropathy is a type of nerve damage. It affects nerves that carry signals between the spinal cord and the arms, legs, and the rest of the body (peripheral nerves). It does not affect nerves in the spinal cord or brain. In peripheral neuropathy, one nerve or a group of nerves may be damaged. Peripheral neuropathy is a broad category that includes many specific nerve disorders, like diabetic neuropathy, hereditary neuropathy, and carpal tunnel syndrome. What are the causes? This condition may be caused by: Diabetes. This is the most common cause of peripheral neuropathy. Nerve injury. Pressure or stress on a nerve that lasts a long time. Lack (deficiency) of B vitamins. This can result from alcoholism, poor diet, or a restricted diet. Infections. Autoimmune diseases, such as rheumatoid arthritis and systemic lupus erythematosus. Nerve diseases that are passed from parent to child (inherited). Some medicines, such as cancer medicines (chemotherapy). Poisonous (toxic) substances, such as lead and mercury. Too little blood flowing to the legs. Kidney disease. Thyroid disease. In some cases, the cause of this condition is not known. What are the signs or symptoms? Symptoms of this condition depend on which of your nerves is damaged. Common symptoms include: Loss of feeling (numbness) in the feet, hands, or both. Tingling in the feet, hands, or both. Burning pain. Very sensitive skin. Weakness. Not being able to move a part of the body (paralysis). Muscle twitching. Clumsiness or poor coordination. Loss of balance. Not being able to control your bladder. Feeling dizzy. Sexual problems. How is this diagnosed? Diagnosing and finding the cause of peripheral neuropathy can be difficult. Your health care provider will take your medical history and do a physical exam. A  neurological exam will also be done. This involves checking things that are affected by your brain, spinal cord, and nerves (nervous system). For example, your health care provider will check your reflexes, how you move, and what you can feel. You may have other tests, such as: Blood tests. Electromyogram (EMG) and nerve conduction tests. These tests check nerve function and how well the nerves are controlling the muscles. Imaging tests, such as CT scans or MRI to rule out other causes of your symptoms. Removing a small piece of nerve to be examined in a lab (nerve biopsy). Removing and examining a small amount of the fluid that surrounds the brain and spinal cord (lumbar puncture). How is this treated? Treatment for this condition may involve: Treating the underlying cause of the neuropathy, such as diabetes, kidney disease, or vitamin deficiencies. Stopping medicines that can cause neuropathy, such as chemotherapy. Medicine to help relieve pain. Medicines may include: Prescription or over-the-counter pain medicine. Antiseizure medicine. Antidepressants. Pain-relieving patches that are applied to painful areas of skin. Surgery to relieve pressure on a nerve or to destroy a nerve that is causing pain. Physical therapy to help improve movement and balance. Devices to help you move around (assistive devices). Follow these instructions at home: Medicines Take over-the-counter and prescription medicines only as told by your health care provider. Do not take any other medicines without first asking your health care provider. Do not drive or use heavy machinery while taking prescription pain medicine. Lifestyle  Do not use any products that contain nicotine or tobacco, such as cigarettes and e-cigarettes. Smoking keeps blood from reaching damaged nerves. If you need help quitting, ask your health care provider. Avoid or limit alcohol. Too much alcohol  can cause a vitamin B deficiency, and vitamin  B is needed for healthy nerves. Eat a healthy diet. This includes: Eating foods that are high in fiber, such as fresh fruits and vegetables, whole grains, and beans. Limiting foods that are high in fat and processed sugars, such as fried or sweet foods. General instructions  If you have diabetes, work closely with your health care provider to keep your blood sugar under control. If you have numbness in your feet: Check every day for signs of injury or infection. Watch for redness, warmth, and swelling. Wear padded socks and comfortable shoes. These help protect your feet. Develop a good support system. Living with peripheral neuropathy can be stressful. Consider talking with a mental health specialist or joining a support group. Use assistive devices and attend physical therapy as told by your health care provider. This may include using a walker or a cane. Keep all follow-up visits as told by your health care provider. This is important. Contact a health care provider if: You have new signs or symptoms of peripheral neuropathy. You are struggling emotionally from dealing with peripheral neuropathy. Your pain is not well-controlled. Get help right away if: You have an injury or infection that is not healing normally. You develop new weakness in an arm or leg. You have fallen or do so frequently. Summary Peripheral neuropathy is when the nerves in the arms, or legs are damaged, resulting in numbness, weakness, or pain. There are many causes of peripheral neuropathy, including diabetes, pinched nerves, vitamin deficiencies, autoimmune disease, and hereditary conditions. Diagnosing and finding the cause of peripheral neuropathy can be difficult. Your health care provider will take your medical history, do a physical exam, and do tests, including blood tests and nerve function tests. Treatment involves treating the underlying cause of the neuropathy and taking medicines to help control pain.  Physical therapy and assistive devices may also help. This information is not intended to replace advice given to you by your health care provider. Make sure you discuss any questions you have with your health care provider. Document Revised: 10/12/2019 Document Reviewed: 10/12/2019 Elsevier Patient Education  2022 [place] ch?n th?c ph?m cho b?nh tro ng??c d? dy th?c qu?n, Ng??i l?n Food Choices for Gastroesophageal Reflux Disease, Adult Khi qu v? b? b?nh tro ng??c d? dy th?c qu?n (GERD), th?c ph?m qu v? ?n v thi quen ?n u?ng c?a qu v? l r?t quan tr?ng. L?a ch?n th?c ph?m ?ng c th? gip lm gi?m c?m gic kh ch?u c?a b?nh tro ng??c d? dy th?c qu?n (GERD). Cn nh?c lm vi?c v?i m?t chuyn gia dinh d??ng nh?m gip qu v? l?a ch?n nh?ng th?c ph?m t?t cho s?c kh?e. C nh?ng l?i khuyn no ?? tun th? k? ho?ch ny? ??c nhn th?c ph?m Tm cc th?c ph?m t ch?t bo bo ha. Cc th?c ph?m c d??i 5% gi tr? hng ngy (DV) ch?t bo v 0 g ch?t bo chuy?n ha c th? gip c?i thi?n cc tri?u ch?ng c?a qu v?. N?u n??ng N?u ?n b?ng cc ph??ng php khc thay vi? chin/rn. Ph??ng php ny c th? bao g?m b? l, h?p, n??ng, ho?c hun nng. ?y l t?t c? cc ph??ng php khng c?n nhi?u ch?t bo ?? n?u. ?? thm h??ng v?, hy th? s? d?ng cc lo?i th?o m?c c t mi v? v ?? axit. Ln k? ho?ch cho b?a ?n  Ch?n nh?ng th?c ph?m t?t cho s?c kh?e t ch?t bo, ch?ng  h?n nh? tri cy, rau c?, ng? c?c nguyn h?t, cc s?n ph?m t? s?a t bo, th?t n?c, c, v th?t gia c?m. ?n cc b?a nh?, th??ng xuyn thay v ba b?a l?n m?i ngy. ?n ch?m ri, trong khng gian th? gin. Trnh g?p ng??i ho?c n?m xu?ng cho ??n khi ?n xong ???c 2-3 gi?. H?n ch? cc th?c ph?m giu ch?t bo ch?ng h?n nh? th?t nhi?u m? ho?c th?c ph?m chin/rn. H?n ch? l??ng th?c ?n nhi?u ch?t bo, ch?ng h?n nh? d?u, b? v m? tr?u. Trnh nh?ng ?i?u sau ?y n?u ???c ch? d?n b?i chuyn gia ch?m [place] s?c kh?e: Cc th?c ph?m gy ra cc tri?u  ch?ng. Nh?ng tri?u ch?ng ny ? m?i ng??i c th? khc nhau. Hy ghi nh?t k th?c ph?m ?? theo di nh?ng th?c ph?m gy ra cc tri?u ch?ng. R??u. U?ng nhi?u ?? l?ng trong cc b?a ?n. ?n trong kho?ng 2-3 gi? tr??c khi ?i ng?. L?i s?ng Duy tr cn n?ng c l?i cho s?c kh?e. H?i chuyn gia ch?m [place] s?c kh?e c?a quy? vi? xem cn n??ng na?o la? c l?i cho s?c kh?e quy? vi?. N?u qu v? c?n gi?m cn, hy trao ??i v?i chuyn gia ch?m [place] s?c kh?e c?a qu v? ?? gi?m cn m?t cch an ton. T?p th? d?c t?i thi?u 30 pht t? 5 ngy tr? ln m?i tu?n, ho?c theo ch? d?n c?a chuyn gia ch?m [place] s?c kh?e. Trnh m?c qu?n o b ch?t quanh th?t l?ng v ng?c. Khng s? d?ng b?t k? s?n ph?m no c nicotine ho?c thu?c l. Nh?ng s?n ph?m ny bao g?m thu?c l d?ng ht, thu?c l d?ng nhai v d?ng c? ht thu?c, ch?ng h?n nh? thu?c l ?i?n t?. N?u qu v? c?n gip ?? ?? cai thu?c, hy h?i chuyn gia ch?m [place] s?c kh?e. K cao ??u gi??ng khi ng?. S? d?ng chm d??i n?m ho?c t?m k d??i khung gi??ng ?? nng ??u gi??ng ln. Nhai k?o cao su khng ???ng sau b?a ?n. Ti nn ?n nh?ng th?c ph?m no? ?n ch? ?? ?n cn b?ng, t?t cho s?c kh?e g?m tri cy, rau c?, ng? c?c nguyn cm, cc s?n ph?m t? s?a t bo, th?t n?c, c v th?t gia c?m. M?i ng??i s? khc nhau. Cc lo?i th?c ph?m c th? gy ra tri?u ch?ng ? m?t ng??i c th? khng gy ra b?t k? tri?u ch?ng no ? ng??i khc. Lm vi?c v?i chuyn gia ch?m [place] s?c kh?e ?? xc ??nh nh?ng th?c ph?m an ton cho qu v?. Nh?ng th?c ph?m li?t k ? trn c th? khng ph?i l m?t danh m?c ??y ?? cc th?c ph?m v ?? u?ng ???c khuy?n ngh?Augustin Coupe h? v?i chuyn gia dinh d??ng ?? c thm thng tin. Ti nn trnh nh?ng th?c ph?m no? H?n ch? m?t s? lo?i th?c ph?m ny c th? gip qu?n l cc tri?u ch?ng c?a GERD. M?i ng??i ??u khc nhau. Hy tham kh?o  ki?n chuyn gia dinh d??ng ho?c chuyn gia ch?m [place] s?c kh?e ?? gip qu v? xc ??nh chnh xc cc lo?i th?c ph?m c?n trnh, n?u c. Tri cy B?t c? lo?i tri cy no  ???c ch? bi?n km thm ch?t bo. B?t c? lo?i tri cy no c th? gy ra cc tri?u ch?ng. ??i v?i m?t s? ng??i, nh?ng lo?i tri cy ny c th? bao g?m tri cy h? cam qut, ch?ng h?n nh? cam, b??i, qu? d?a v chanh. Marlou Starks c? Marlou Starks c? chin ng?p  d?u. Khoai ty chin. B?t c? lo?i rau c? no ???c ch? bi?n km thm ch?t bo. B?t c? lo?i rau c? no gy ra cc tri?u ch?ng. ??i v?i m?t s? ng??i, nh?ng lo?i rau c? ny c th? bao g?m c chua v cc s?n ph?m t? c chua, ?t, hnh v t?i v c?i ng?a. Ng? c?c Bnh ng?t ho?c bnh m n??ng nhanh b? sung ch?t bo. Cc lo?i th?t v protein khc Cc lo?i th?t giu ch?t bo, ch?ng h?n nh? th?t l?n ho?c th?t b nhi?u m?, xc xch, s??n, gi?m bng, l?p x??ng, [place] v th?t l?n mu?i xng khi. Th?t ho?c protein chin/rn, bao g?m c chin/rn v th?t g chin/rn. Qu? h?ch v b? h?t v?i l??ng l?n. S?a S?a nguyn kem v s?a s-c-la. Kem chuaSalome Spotted. Kem ? l?nh. Pho mt kem. S?a l?c. M? v d?u B?. B? th?c v?t. M? tr?u (shortening). B? s??a tru. ?? u?ng C ph v tr, c ho?c khng c caffeine. ?? u?ng c ga. Soda. ?? u?ng t?ng l?c. N??c p tri cy lm t? tri cy chua, ch?ng h?n nh? cam ho?c b??i. N??c p c chua. ?? u?ng ch?a c?n. K?o v ?? trng mi?ng S-c-la v [place]. Bnh rn. Cassell Smiles v? H?t tiu. B?c h v b?c h l?c. Thm mu?i. B?t c? lo?i gia v?, th?o d??c, ho?c gia v? no gy ra cc tri?u ch?ng. ??i v?i m?t s? ng??i, gia v? v cc th?c ph?m khc c th? bao g?m c ri, n??c s?t nng, ho?c d?u d?m ?? tr?n salad. Nh?ng th?c ph?m li?t k ? trn c th? khng ph?i l m?t danh m?c ??y ?? cc th?c ph?m v ?? u?ng c?n trnh. Lin h? v?i chuyn gia dinh d??ng ?? c thm thng tin. Hy h?i chuyn gia ch?m [place] s?c kh?e c?a qu v? Cc thay ??i v? ch? ?? ?n v l?i s?ng th??ng l nh?ng b??c ??u tin ???c th?c hi?n ?? qu?n l cc tri?u ch?ng c?a GERD. N?u thay ??i v? ch? ?? ?n v l?i s?ng khng c?i thi?n cc tri?u ch?ng c?a qu v?, hy trao ??i v?i chuyn gia ch?m [place] s?c kh?e v? vi?c  dng thu?c. N?i tm thm thng tin [employer] for Gastrointestinal Disorders (Qu? Qu?c t? v? R?i lo?n Tiu ha): www.iffgd.org Tm t?t Khi qu v? b? b?nh tro ng??c d? dy th?c qu?n (GERD), l?a ch?n th?c ph?m v l?i s?ng c th? r?t h?u ch trong vi?c lm gi?m c?m gic kh ch?u c?a GERD. ?n cc b?a nh?, th??ng xuyn thay v ba b?a l?n m?i ngy. ?n ch?m ri, trong khng gian th? gin. Trnh g?p ng??i ho?c n?m xu?ng cho ??n khi ?n xong ???c 2-3 gi?. H?n ch? cc th?c ph?m giu ch?t bo ch?ng h?n nh? th?t nhi?u m? ho?c th?c ph?m chin/rn. Thng tin ny khng nh?m m?c ?ch thay th? cho l?i khuyn m chuyn gia ch?m [place] s?c kh?e ni v?i qu v?. Hy b?o ??m qu v? ph?i th?o lu?n b?t k? v?n ?? g m qu v? c v?i chuyn gia ch?m [place] s?c kh?e c?a qu v?. Document Revised: 08/16/2019 Document Reviewed: 08/16/2019 Elsevier Patient Education  2022 [employer].

## 2021-02-07 NOTE — Progress Notes (Signed)
Subjective:    Patient ID: Haley Francis, female    DOB: 12-22-1955, 66 y.o.   MRN: [id number]  HPI Pt is a 66 yo female who speaks vietnamese and accompanied by her daughter who helps interpret.   Pt is having overall body aches in joints and muscle pains off and on for the last 6 years but worse over last 2 months. In 2020 she had some lumbar radiculopathy that resolved with PT and NSAIDs. Recently she has had both feet numb and tingling. No strength changes. No back pain. Not taking anything for it. She has had chemo about 6 years ago. She denies any new injuries. Symptoms seem to be worse at night.   Continues to have some epigastric pain. Omeprazole did help but when she ran out of it she did not call for refills. No hematochezia or melena.  .   .. 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     Objective:   Physical Exam Vitals reviewed.  Constitutional:      Appearance: Normal appearance.  HENT:     Head: Normocephalic.  Cardiovascular:     Rate and Rhythm: Normal rate and regular rhythm.     Pulses: Normal pulses.     Heart sounds: Normal heart sounds.  Pulmonary:     Effort: Pulmonary effort is normal.      Breath sounds: Normal breath sounds.  Abdominal:     General: There is no distension.     Palpations: Abdomen is soft. There is no mass.     Tenderness: There is no abdominal tenderness. There is no right CVA tenderness, left CVA tenderness, guarding or rebound.     Hernia: No hernia is present.  Musculoskeletal:     Right lower leg: No edema.     Left lower leg: No edema.     Comments: Normal ROM of bilateral feet and ankles without pain.  5/5 strength of both feet. Pedal pulses strong 2+ bilaterally.   Neurological:     General: No focal deficit present.     Mental Status: She is alert and oriented to person, place, and time.  Psychiatric:        Mood and Affect: Mood normal.          Assessment & Plan:  Marland KitchenMarland KitchenLaquasia was seen today for foot pain.  Diagnoses and all orders for this visit:  Neuropathy -     TSH -     COMPLETE METABOLIC PANEL WITH GFR -     CBC with Differential/Platelet -     Sed Rate (ESR) -     L-Methylfolate-B6-B12 (FOLTANX) 3-35-2 MG TABS; Take 1 tablet by mouth 2 (two) times daily. -  B12 and Folate Panel -     VITAMIN D 25 Hydroxy (Vit-D Deficiency, Fractures) -     C-reactive protein -     Ambulatory referral to Physical Therapy  Arthralgia, unspecified joint -     TSH -     COMPLETE METABOLIC PANEL WITH GFR -     CBC with Differential/Platelet -     Sed Rate (ESR) -     B12 and Folate Panel -     VITAMIN D 25 Hydroxy (Vit-D Deficiency, Fractures) -     C-reactive protein  Myalgia -     TSH -     COMPLETE METABOLIC PANEL WITH GFR -     CBC with Differential/Platelet -     Sed Rate (ESR) -     B12 and Folate Panel -     VITAMIN D 25 Hydroxy (Vit-D Deficiency, Fractures) -     C-reactive protein  Epigastric pain -     omeprazole (PRILOSEC) 40 MG capsule; Take 1 capsule (40 mg total) by mouth daily.  Osteoporosis screening -     DG Bone Density; Future  Visit for screening mammogram -     MM 3D SCREEN BREAST BILATERAL  History of  chemotherapy -     L-Methylfolate-B6-B12 (FOLTANX) 3-35-2 MG TABS; Take 1 tablet by mouth 2 (two) times daily.  Lumbar radiculopathy -     Ambulatory referral to Physical Therapy   Great circulation. Symptoms sound like neuropathy from chemotherapy Discussed gabapentin Would like to try foltanx first Labs ordered to look at any other reasons for symptoms Take turmeric for inflammation and overall joint and muscle pain ? If some of left is her lumbar radiculopathy flaring. No pain reported in low back today.  Will try another round of PT.    Her epigastric symptoms did go away with omeprazole. Stay on this. Follow up as needed.    Pt needs mammogram and bone density. Please schedule.

## 2021-02-09 ENCOUNTER — Encounter: Payer: Self-pay | Admitting: Physician Assistant

## 2021-02-09 DIAGNOSIS — Z9221 Personal history of antineoplastic chemotherapy: Secondary | ICD-10-CM | POA: Insufficient documentation

## 2021-02-09 DIAGNOSIS — R1013 Epigastric pain: Secondary | ICD-10-CM | POA: Insufficient documentation

## 2021-02-09 DIAGNOSIS — M2550 Pain in unspecified joint: Secondary | ICD-10-CM | POA: Insufficient documentation

## 2021-02-09 DIAGNOSIS — M5416 Radiculopathy, lumbar region: Secondary | ICD-10-CM | POA: Insufficient documentation

## 2021-02-09 DIAGNOSIS — M791 Myalgia, unspecified site: Secondary | ICD-10-CM | POA: Insufficient documentation

## 2021-02-09 DIAGNOSIS — G629 Polyneuropathy, unspecified: Secondary | ICD-10-CM | POA: Insufficient documentation

## 2021-02-09 NOTE — Progress Notes (Signed)
Haley Francis,   Kidney, liver, glucose looks good.  Thyroid looks perfect.  WBC and hemoglobin look good.  B12 looks great.  Vitamin D normal but low normal. Make sure taking at least 1038mg a day.  CRP normal.  ESR a little high meaning inflammation in body.  Please add ANA, JJ/Key.

## 2021-02-12 ENCOUNTER — Encounter: Payer: Self-pay | Admitting: Physician Assistant

## 2021-02-12 ENCOUNTER — Other Ambulatory Visit: Payer: Self-pay

## 2021-02-12 ENCOUNTER — Ambulatory Visit: Payer: Medicare Other | Attending: Physician Assistant | Admitting: Physical Therapy

## 2021-02-12 DIAGNOSIS — G629 Polyneuropathy, unspecified: Secondary | ICD-10-CM | POA: Diagnosis not present

## 2021-02-12 DIAGNOSIS — M6281 Muscle weakness (generalized): Secondary | ICD-10-CM | POA: Insufficient documentation

## 2021-02-12 DIAGNOSIS — R202 Paresthesia of skin: Secondary | ICD-10-CM | POA: Insufficient documentation

## 2021-02-12 DIAGNOSIS — R208 Other disturbances of skin sensation: Secondary | ICD-10-CM | POA: Insufficient documentation

## 2021-02-12 DIAGNOSIS — M62838 Other muscle spasm: Secondary | ICD-10-CM | POA: Diagnosis not present

## 2021-02-12 DIAGNOSIS — M5416 Radiculopathy, lumbar region: Secondary | ICD-10-CM | POA: Insufficient documentation

## 2021-02-12 NOTE — Therapy (Signed)
[place], [place], [address] Phone: [phone]   Fax:  [phone]  Physical Therapy Evaluation  Patient Details  Name: Haley Francis MRN: [id number] Date of Birth: 1955-12-20 Referring Provider (PT): Donella Stade, [place]   Encounter Date: 02/12/2021   PT End of Session - 02/12/21 1327     Visit Number 1    Number of Visits 12    Date for PT Re-Evaluation 03/26/21    Authorization Type UHC Medicare    PT Start Time 1100    PT Stop Time 1145    PT Time Calculation (min) 45 min    Activity Tolerance Patient tolerated treatment well    Behavior During Therapy [facility] for tasks assessed/performed             No past medical history on file.  No past surgical history on file.  There were no vitals filed for this visit.    Subjective Assessment - 02/12/21 1107     Subjective Pt is vietnamese speaking. Subjective provided with help from pt's daughter. Daughter reports that bilat feet go numb. This has been worse within the last 2 months. Pt was given chemo and radiation 6 years ago -- not sure if this is a factor. In the past dry needling had helped pt for her back pain/sciatica. Daughter states she did PT ~2 years ago and had reduced symptoms. No back pain noted currently (had back pain when she saw PT 2 years ago), but just the numbness. At night propping the legs up and using ointment it would feel better. Nothing seems to make it worse. 6 or 7/10 numbness. Pt has continued nerve glide exercises.    Patient is accompained by: Family member   Daughter   Limitations Standing;Walking;Sitting    How long can you sit comfortably? No issues    How long can you stand comfortably? No issues    How long can you walk comfortably? No issues    Patient Stated Goals Improve numbness    Currently in Pain? No/denies                [facility] PT Assessment - 02/12/21 0001       Assessment   Medical  Diagnosis G62.9 (ICD-10-CM) - Neuropathy  M54.16 (ICD-10-CM) - Lumbar radiculopathy    Referring Provider (PT) Alden Hipp, Jade L, PA-C    Onset Date/Surgical Date --   ~2 months ago   Hand Dominance Right    Prior Therapy 2 years ago for back      Precautions   Precautions None      Restrictions   Weight Bearing Restrictions No      Balance Screen   Has the patient fallen in the past 6 months No      [place] residence    Living Arrangements Children;Spouse/significant other    Available Help at Discharge Family    Type of [place]      Prior Function   Level of Rifle Retired      Observation/Other Assessments   Focus on Therapeutic Outcomes (FOTO)  n/a      Sensation   Light Touch Impaired Detail    Light Touch Impaired Details Impaired RLE;Impaired LLE   Reports sensation is intact from proximal mid calf and up. Most of numbness/tingling along lateral ankle/foot and into toes     ROM / Strength   AROM /  PROM / Strength Strength      Strength   Strength Assessment Site Hip;Knee    Right/Left Hip Right;Left    Right Hip Flexion 4/5    Right Hip Extension 4-/5    Right Hip ABduction 4-/5    Left Hip Flexion 4/5    Left Hip Extension 4-/5    Left Hip ABduction 4-/5    Right/Left Knee Left;Right    Right Knee Flexion 5/5    Right Knee Extension 5/5    Left Knee Flexion 5/5    Left Knee Extension 5/5    Right/Left Ankle Right;Left    Right Ankle Dorsiflexion 5/5    Right Ankle Plantar Flexion 4+/5    Right Ankle Inversion 4+/5    Right Ankle Eversion 4/5    Left Ankle Dorsiflexion 5/5    Left Ankle Plantar Flexion 4+/5    Left Ankle Inversion 4+/5    Left Ankle Eversion 4/5      Palpation   Palpation comment Taut fibularis longus and ant tib bilat, gastroc/soleus (reported improved symptoms with palpation), reports improved symptoms with fibular head PA mobs bilat      Special Tests     Special Tests Lumbar    Lumbar Tests Straight Leg Raise;Slump Test      Slump test   Findings Positive    Comment Reports some "zing" down the leg but daughter is not sure if this is a cramp or not   limited by language barrier     Straight Leg Raise   Findings Negative                        Objective measurements completed on examination: See above findings.         Trigger Point Dry Needling - 02/12/21 0001     Consent Given? Yes    Education Handout Provided Previously provided    Muscles Treated Lower Quadrant Anterior tibialis;Peroneals    Peroneals Response Palpable increased muscle length    Anterior tibialis Response Palpable increased muscle length                   PT Education - 02/12/21 1326     Education Details Exam findings, POC, initial HEP    Person(s) Educated Patient;Child(ren)    Methods Explanation;Demonstration;Tactile cues;Verbal cues;Handout    Comprehension Verbalized understanding;Returned demonstration;Verbal cues required;Tactile cues required;Need further instruction                 PT Long Term Goals - 02/12/21 1703       PT LONG TERM GOAL #1   Title Patient to be independent with advanced HEP.    Time 6    Period Weeks    Status New    Target Date 03/26/21      PT LONG TERM GOAL #2   Title Patient to demonstrate B LE strength >/=4+/5.    Baseline 4-/5 bilat LEs    Time 6    Period Weeks    Status New    Target Date 03/26/21      PT LONG TERM GOAL #3   Title Pt will report >50% decrease in her N/T in bilat LEs    Time 6    Period Weeks    Status New    Target Date 03/26/21                    Plan - 02/12/21 1336  Clinical Impression Statement [name] is a 66 y/o F presenting to OPPT due to complaint of bilat LE numbness/tingling. She is primarily [place] speaking. Daughter present to assist with interpretation. Pt has had a prior history of back pain with sciatica  that had responded well to TPDN. Pt currently has no pain and is able to perform all of her daily functions. Greatest complaint is the decreased sensation in her foot/ankle (greater N/T lateral foot/ankle and into toes). Assessment significant for taut ant tib and peroneals with possible peroneal nerve impingement. Pt reported good response with manual work along that musculature and with PA fibular mobs during PT palpation. No overt s/s of active sciatica; however, this may be a contributing factor and pt does demonstrate general hip weakness. Pt would benefit from trial of PT to decrease her N/T for improved comfort with standing tasks.    Personal Factors and Comorbidities Age;Time since onset of injury/illness/exacerbation;Past/Current Experience;Other   language barrier   Examination-Activity Limitations Stand;Squat    Stability/Clinical Decision Making Stable/Uncomplicated    Clinical Decision Making Low    Rehab Potential Good    PT Frequency 2x / week    PT Duration 6 weeks    PT Treatment/Interventions ADLs/Self Care Home Management;Electrical Stimulation;Moist Heat;Traction;Cryotherapy;Gait training;Stair training;Functional mobility training;Therapeutic activities;Neuromuscular re-education;Balance training;Therapeutic exercise;Patient/family education;Manual techniques;Passive range of motion;Dry needling;Taping;Joint Manipulations    PT Next Visit Plan Assess response to HEP and self massage at home. TPDN as needed gastroc/soleus, ant tib, peroneals. Manual work as needed. Stretch lumbar and LEs. Work on hip abductor and Social worker    PT Home Exercise Plan Access Code [email or web address]    Consulted and Agree with Plan of Care Patient;Family member/caregiver    Family Member Consulted Daughter             Patient will benefit from skilled therapeutic intervention in order to improve the following deficits and impairments:  Increased fascial restricitons, Increased muscle spasms,  Decreased skin integrity, Impaired sensation, Decreased strength  Visit Diagnosis: Muscle weakness (generalized)  Other disturbances of skin sensation  Paresthesia of skin  Other muscle spasm     Problem List Patient Active Problem List   Diagnosis Date Noted   Epigastric pain 02/09/2021   Myalgia 02/09/2021   Arthralgia 02/09/2021   Neuropathy 02/09/2021   History of chemotherapy 02/09/2021   Lumbar radiculopathy 02/09/2021   Bilateral shoulder pain 10/12/2019   Facet hypertrophy of lumbar region 10/12/2018   Cough 06/02/2017   Rectal bleeding 06/02/2017   Malignant neoplasm of exocervix ([place]) 03/25/2016   Neoplasm of female genital organ 10/27/2014   Abnormal Papanicolaou smear of cervix with positive human papilloma virus (HPV) test 10/27/2014   Pre-diabetes 06/23/2013   Polyp of colon, adenomatous 03/19/2013   Abnormal mammogram 02/28/2013    [facility] April Gordy Levan, PT, DPT 02/12/2021, 5:06 PM  [facility] [place], [place], [address] Phone: [phone]   Fax:  [phone]  Name: Haley Francis MRN: [id number] Date of Birth: [birth year]

## 2021-02-16 LAB — CBC WITH DIFFERENTIAL/PLATELET
Absolute Monocytes: 659 cells/uL (ref 200–950)
Basophils Absolute: 30 cells/uL (ref 0–200)
Basophils Relative: 0.4 %
Eosinophils Absolute: 141 cells/uL (ref 15–500)
Eosinophils Relative: 1.9 %
HCT: 36.2 % (ref 35.0–45.0)
Hemoglobin: 12.1 g/dL (ref 11.7–15.5)
Lymphs Abs: 1458 cells/uL (ref 850–3900)
MCH: 29.7 pg (ref 27.0–33.0)
MCHC: 33.4 g/dL (ref 32.0–36.0)
MCV: 88.9 fL (ref 80.0–100.0)
MPV: 10.1 fL (ref 7.5–12.5)
Monocytes Relative: 8.9 %
Neutro Abs: 5113 cells/uL (ref 1500–7800)
Neutrophils Relative %: 69.1 %
Platelets: 290 10*3/uL (ref 140–400)
RBC: 4.07 10*6/uL (ref 3.80–5.10)
RDW: 12.7 % (ref 11.0–15.0)
Total Lymphocyte: 19.7 %
WBC: 7.4 10*3/uL (ref 3.8–10.8)

## 2021-02-16 LAB — COMPLETE METABOLIC PANEL WITH GFR
AG Ratio: 1.8 (calc) (ref 1.0–2.5)
ALT: 22 U/L (ref 6–29)
AST: 22 U/L (ref 10–35)
Albumin: 4.2 g/dL (ref 3.6–5.1)
Alkaline phosphatase (APISO): 61 U/L (ref 37–153)
BUN: 18 mg/dL (ref 7–25)
CO2: 33 mmol/L — ABNORMAL HIGH (ref 20–32)
Calcium: 10.1 mg/dL (ref 8.6–10.4)
Chloride: 105 mmol/L (ref 98–110)
Creat: 0.73 mg/dL (ref 0.50–1.05)
Globulin: 2.3 g/dL (calc) (ref 1.9–3.7)
Glucose, Bld: 97 mg/dL (ref 65–99)
Potassium: 4.7 mmol/L (ref 3.5–5.3)
Sodium: 142 mmol/L (ref 135–146)
Total Bilirubin: 0.3 mg/dL (ref 0.2–1.2)
Total Protein: 6.5 g/dL (ref 6.1–8.1)
eGFR: 91 mL/min/{1.73_m2} (ref >=60)

## 2021-02-16 LAB — C-REACTIVE PROTEIN: CRP: 4.2 mg/L (ref <8.0)

## 2021-02-16 LAB — TSH: TSH: 1.49 mIU/L (ref 0.40–4.50)

## 2021-02-16 LAB — SEDIMENTATION RATE: Sed Rate: 39 mm/h — ABNORMAL HIGH (ref 0–30)

## 2021-02-16 LAB — B12 AND FOLATE PANEL
Folate: 14 ng/mL
Vitamin B-12: 474 pg/mL (ref 200–1100)

## 2021-02-16 LAB — VITAMIN D 25 HYDROXY (VIT D DEFICIENCY, FRACTURES): Vit D, 25-Hydroxy: 34 ng/mL (ref 30–100)

## 2021-02-16 LAB — ANA: Anti Nuclear Antibody (ANA): NEGATIVE

## 2021-02-19 ENCOUNTER — Ambulatory Visit: Payer: Medicare Other | Attending: Physician Assistant | Admitting: Physical Therapy

## 2021-02-19 ENCOUNTER — Other Ambulatory Visit: Payer: Self-pay

## 2021-02-19 DIAGNOSIS — M62838 Other muscle spasm: Secondary | ICD-10-CM

## 2021-02-19 DIAGNOSIS — R202 Paresthesia of skin: Secondary | ICD-10-CM

## 2021-02-19 DIAGNOSIS — M6281 Muscle weakness (generalized): Secondary | ICD-10-CM | POA: Diagnosis not present

## 2021-02-19 DIAGNOSIS — R208 Other disturbances of skin sensation: Secondary | ICD-10-CM

## 2021-02-19 NOTE — Therapy (Signed)
[place], [place], [address] Phone: [phone]   Fax:  [phone]  Physical Therapy Treatment  Patient Details  Name: Haley Francis MRN: [id number] Date of Birth: 1955/06/23 Referring Provider (PT): Donella Stade, [place]   Encounter Date: 02/19/2021   PT End of Session - 02/19/21 0940     Visit Number 2    Number of Visits 12    Date for PT Re-Evaluation 03/26/21    Authorization Type UHC Medicare    PT Start Time 0940   Late arrival   PT Stop Time 1020    PT Time Calculation (min) 40 min    Activity Tolerance Patient tolerated treatment well    Behavior During Therapy [facility] for tasks assessed/performed             No past medical history on file.  No past surgical history on file.  There were no vitals filed for this visit.                      [facility] Adult PT Treatment/Exercise - 02/19/21 0001       Exercises   Exercises Lumbar      Lumbar Exercises: Stretches   Single Knee to Chest Stretch Right;Left;30 seconds    Figure 4 Stretch 30 seconds;Seated    Other Lumbar Stretch Exercise Soleus stretch x 30 sec each leg in standing    Other Lumbar Stretch Exercise Peroneal nerve glides in long sitting x10      Lumbar Exercises: Standing   Heel Raises 20 reps    Heel Raises Limitations with ball squeeze between heels      Manual Therapy   Manual Therapy Soft tissue mobilization;Joint mobilization;Neural Stretch;Passive ROM    Manual therapy comments Skilled assessment and palpation for TPDN    Joint Mobilization Gentle PA mobs lower lumbar grade II    Soft tissue mobilization STM glutes, piriformis, ant tib    Passive ROM Hamstring stretch x 30 sec bilat, soleus stretch x 30 sec each leg    Neural Stretch Peroneal nerve glides in supine with manual assist x 30 sec each leg              Trigger Point Dry Needling - 02/19/21 0001     Consent Given? Yes     Education Handout Provided Previously provided    Muscles Treated Back/Hip Gluteus medius;Gluteus maximus;Lumbar multifidi    Gluteus Medius Response Palpable increased muscle length    Gluteus Maximus Response Palpable increased muscle length    Lumbar multifidi Response Palpable increased muscle length                        PT Long Term Goals - 02/12/21 1703       PT LONG TERM GOAL #1   Title Patient to be independent with advanced HEP.    Time 6    Period Weeks    Status New    Target Date 03/26/21      PT LONG TERM GOAL #2   Title Patient to demonstrate B LE strength >/=4+/5.    Baseline 4-/5 bilat LEs    Time 6    Period Weeks    Status New    Target Date 03/26/21      PT LONG TERM GOAL #3   Title Pt will report >50% decrease in her N/T in bilat LEs    Time 6  Period Weeks    Status New    Target Date 03/26/21                    Patient will benefit from skilled therapeutic intervention in order to improve the following deficits and impairments:     Visit Diagnosis: Muscle weakness (generalized)  Other disturbances of skin sensation  Paresthesia of skin  Other muscle spasm     Problem List Patient Active Problem List   Diagnosis Date Noted   Epigastric pain 02/09/2021   Myalgia 02/09/2021   Arthralgia 02/09/2021   Neuropathy 02/09/2021   History of chemotherapy 02/09/2021   Lumbar radiculopathy 02/09/2021   Bilateral shoulder pain 10/12/2019   Facet hypertrophy of lumbar region 10/12/2018   Cough 06/02/2017   Rectal bleeding 06/02/2017   Malignant neoplasm of exocervix ([place]) 03/25/2016   Neoplasm of female genital organ 10/27/2014   Abnormal Papanicolaou smear of cervix with positive human papilloma virus (HPV) test 10/27/2014   Pre-diabetes 06/23/2013   Polyp of colon, adenomatous 03/19/2013   Abnormal mammogram 02/28/2013    [facility] April Gordy Levan, PT, DPT 02/19/2021, 12:23 PM  [facility] [place], [place], [address] Phone: [phone]   Fax:  [phone]  Name: Haley Francis MRN: [id number] Date of Birth: [birth year]

## 2021-02-21 ENCOUNTER — Ambulatory Visit: Payer: Medicare Other | Admitting: Physical Therapy

## 2021-02-21 ENCOUNTER — Other Ambulatory Visit: Payer: Self-pay

## 2021-02-21 DIAGNOSIS — M62838 Other muscle spasm: Secondary | ICD-10-CM | POA: Diagnosis not present

## 2021-02-21 DIAGNOSIS — R202 Paresthesia of skin: Secondary | ICD-10-CM | POA: Diagnosis not present

## 2021-02-21 DIAGNOSIS — M6281 Muscle weakness (generalized): Secondary | ICD-10-CM | POA: Diagnosis not present

## 2021-02-21 DIAGNOSIS — R208 Other disturbances of skin sensation: Secondary | ICD-10-CM

## 2021-02-21 NOTE — Therapy (Signed)
[place], [place], [address] Phone: [phone]   Fax:  [phone]  Physical Therapy Treatment  Patient Details  Name: Haley Francis MRN: [id number] Date of Birth: 1955-11-06 Referring Provider (PT): Donella Stade, [place]   Encounter Date: 02/21/2021   PT End of Session - 02/21/21 1432     Visit Number 3    Number of Visits 12    Date for PT Re-Evaluation 03/26/21    Authorization Type UHC Medicare    Authorization - Visit Number 3    Progress Note Due on Visit 10    PT Start Time 1350    PT Stop Time 1430    PT Time Calculation (min) 40 min    Activity Tolerance Patient tolerated treatment well    Behavior During Therapy [facility] for tasks assessed/performed             No past medical history on file.  No past surgical history on file.  There were no vitals filed for this visit.   Subjective Assessment - 02/21/21 1355     Subjective Pt states that her back does not hurt but her legs still hurt    Currently in Pain? No/denies                               [facility] Adult PT Treatment/Exercise - 02/21/21 0001       Lumbar Exercises: Stretches   Single Knee to Chest Stretch Right;Left;30 seconds    Figure 4 Stretch 30 seconds;Seated    Other Lumbar Stretch Exercise Soleus stretch x 30 sec each leg in standing      Lumbar Exercises: Aerobic   Tread Mill 1.68mph x 3 min for warm up      Lumbar Exercises: Standing   Heel Raises 20 reps    Heel Raises Limitations with ball squeeze between heels    Other Standing Lumbar Exercises resisted walking red TB backward/laterally both sides x 5 each    Other Standing Lumbar Exercises side step with red TB around feet      Manual Therapy   Manual therapy comments Skilled assessment and palpation for TPDN    Soft tissue mobilization STM glutes, piriformis, ant tib    Passive ROM Hamstring stretch x 30 sec bilat, soleus stretch  x 30 sec each leg    Neural Stretch Peroneal nerve glides in supine with manual assist x 30 sec each leg              Trigger Point Dry Needling - 02/21/21 0001     Peroneals Response Palpable increased muscle length    Anterior tibialis Response Palpable increased muscle length                        PT Long Term Goals - 02/12/21 1703       PT LONG TERM GOAL #1   Title Patient to be independent with advanced HEP.    Time 6    Period Weeks    Status New    Target Date 03/26/21      PT LONG TERM GOAL #2   Title Patient to demonstrate B LE strength >/=4+/5.    Baseline 4-/5 bilat LEs    Time 6    Period Weeks    Status New    Target Date 03/26/21      PT LONG  TERM GOAL #3   Title Pt will report >50% decrease in her N/T in bilat LEs    Time 6    Period Weeks    Status New    Target Date 03/26/21                   Plan - 02/21/21 1433     Clinical Impression Statement Pt with good tolerance to side stepping and resisted walking. Pt with good response to dry needling today.    PT Next Visit Plan core and LE strengthening, update HEP, self massage/manual as needed    PT Home Exercise Plan Access Code [email or web address]    Consulted and Agree with Plan of Care Patient   mobile interpreter box            Patient will benefit from skilled therapeutic intervention in order to improve the following deficits and impairments:     Visit Diagnosis: Muscle weakness (generalized)  Other disturbances of skin sensation  Paresthesia of skin     Problem List Patient Active Problem List   Diagnosis Date Noted   Epigastric pain 02/09/2021   Myalgia 02/09/2021   Arthralgia 02/09/2021   Neuropathy 02/09/2021   History of chemotherapy 02/09/2021   Lumbar radiculopathy 02/09/2021   Bilateral shoulder pain 10/12/2019   Facet hypertrophy of lumbar region 10/12/2018   Cough 06/02/2017   Rectal bleeding 06/02/2017   Malignant neoplasm of exocervix ([place])  03/25/2016   Neoplasm of female genital organ 10/27/2014   Abnormal Papanicolaou smear of cervix with positive human papilloma virus (HPV) test 10/27/2014   Pre-diabetes 06/23/2013   Polyp of colon, adenomatous 03/19/2013   Abnormal mammogram 02/28/2013    Haley Francis, PT 02/21/2021, 2:36 PM  [facility] [place] [facility], [place], [address] Phone: [phone]   Fax:  [phone]  Name: Haley Francis MRN: [id number] Date of Birth: Mar 20, 1955

## 2021-02-28 ENCOUNTER — Ambulatory Visit: Payer: Medicare Other | Admitting: Physical Therapy

## 2021-02-28 ENCOUNTER — Ambulatory Visit (INDEPENDENT_AMBULATORY_CARE_PROVIDER_SITE_OTHER): Payer: Medicare Other

## 2021-02-28 ENCOUNTER — Encounter: Payer: Self-pay | Admitting: Physician Assistant

## 2021-02-28 ENCOUNTER — Other Ambulatory Visit: Payer: Self-pay

## 2021-02-28 DIAGNOSIS — R202 Paresthesia of skin: Secondary | ICD-10-CM

## 2021-02-28 DIAGNOSIS — M8588 Other specified disorders of bone density and structure, other site: Secondary | ICD-10-CM

## 2021-02-28 DIAGNOSIS — Z1231 Encounter for screening mammogram for malignant neoplasm of breast: Secondary | ICD-10-CM | POA: Diagnosis not present

## 2021-02-28 DIAGNOSIS — M6281 Muscle weakness (generalized): Secondary | ICD-10-CM | POA: Diagnosis not present

## 2021-02-28 DIAGNOSIS — M62838 Other muscle spasm: Secondary | ICD-10-CM | POA: Diagnosis not present

## 2021-02-28 DIAGNOSIS — M8580 Other specified disorders of bone density and structure, unspecified site: Secondary | ICD-10-CM | POA: Insufficient documentation

## 2021-02-28 DIAGNOSIS — R208 Other disturbances of skin sensation: Secondary | ICD-10-CM | POA: Diagnosis not present

## 2021-02-28 DIAGNOSIS — Z13820 Encounter for screening for osteoporosis: Secondary | ICD-10-CM

## 2021-02-28 DIAGNOSIS — Z780 Asymptomatic menopausal state: Secondary | ICD-10-CM | POA: Diagnosis not present

## 2021-02-28 DIAGNOSIS — M8589 Other specified disorders of bone density and structure, multiple sites: Secondary | ICD-10-CM | POA: Diagnosis not present

## 2021-02-28 NOTE — Patient Instructions (Signed)
Access Code: [email or web address] URL: [email or web address] Date: 02/28/2021 Prepared by: Isabelle Course  Exercises Seated Figure 4 Piriformis Stretch - 1 x daily - 7 x weekly - 2 sets - 30 sec hold Hooklying Single Knee to Chest Stretch - 1 x daily - 7 x weekly - 1 sets - 3 reps - 20-30 sec hold Clam - 1 x daily - 7 x weekly - 2 sets - 10 reps Long Sitting Tibial Nerve Flossing - 1 x daily - 7 x weekly - 1 sets - 10 reps Soleus Stretch with Foot at Wall - 1 x daily - 7 x weekly - 1 sets - 30 sec hold Heel Raises with Counter Support - 1 x daily - 7 x weekly - 2 sets - 10 reps Side Stepping with Resistance at Feet - 1 x daily - 7 x weekly - 3 sets - 10 reps

## 2021-02-28 NOTE — Therapy (Signed)
[place], [place], [address] Phone: [phone]   Fax:  [phone]  Physical Therapy Treatment  Patient Details  Name: Haley Francis MRN: [id number] Date of Birth: 1955-09-26 Referring Provider (PT): Donella Stade, [place]   Encounter Date: 02/28/2021   PT End of Session - 02/28/21 1143     Visit Number 4    Number of Visits 12    Date for PT Re-Evaluation 03/26/21    Authorization Type UHC Medicare    Authorization - Visit Number 4    Progress Note Due on Visit 10    PT Start Time 1100    PT Stop Time 1143    PT Time Calculation (min) 43 min    Activity Tolerance Patient tolerated treatment well    Behavior During Therapy [facility] for tasks assessed/performed             No past medical history on file.  No past surgical history on file.  There were no vitals filed for this visit.   Subjective Assessment - 02/28/21 1104     Subjective Pt states she has pain in her hips but not in her legs. She states she still has numbness in leg    Patient Stated Goals Improve numbness    Currently in Pain? Yes    Pain Score 2     Pain Location Hip    Pain Orientation Left;Right    Pain Descriptors / Indicators Sore                               [facility] Adult PT Treatment/Exercise - 02/28/21 0001       Lumbar Exercises: Stretches   Passive Hamstring Stretch Right;Left;2 reps;30 seconds    Single Knee to Chest Stretch Right;Left;2 reps;30 seconds    Figure 4 Stretch 2 reps;30 seconds    Other Lumbar Stretch Exercise Soleus stretch x 30 sec each leg in standing      Lumbar Exercises: Aerobic   Recumbent Bike L2 x 5 min for warm up      Lumbar Exercises: Standing   Heel Raises 20 reps    Heel Raises Limitations with ball squeeze between heels    Other Standing Lumbar Exercises SLS on foam 15 sec Rt, limited by pain on Lt, tandem stance on foam 2 x 30 sec each    Other  Standing Lumbar Exercises side step with red TB around feet      Lumbar Exercises: Sidelying   Clam 20 reps    Clam Limitations with ball between ankles      Manual Therapy   Soft tissue mobilization STM ant tib, bilat glutes and piriformis    Neural Stretch Peroneal nerve glides in supine with manual assist x 30 sec each leg                     PT Education - 02/28/21 1140     Education Details updated HEP    Person(s) Educated Patient;Child(ren)    Methods Demonstration;Explanation;Handout    Comprehension Returned demonstration;Verbalized understanding                 PT Long Term Goals - 02/12/21 1703       PT LONG TERM GOAL #1   Title Patient to be independent with advanced HEP.    Time 6    Period Weeks    Status  New    Target Date 03/26/21      PT LONG TERM GOAL #2   Title Patient to demonstrate B LE strength >/=4+/5.    Baseline 4-/5 bilat LEs    Time 6    Period Weeks    Status New    Target Date 03/26/21      PT LONG TERM GOAL #3   Title Pt will report >50% decrease in her N/T in bilat LEs    Time 6    Period Weeks    Status New    Target Date 03/26/21                   Plan - 02/28/21 1144     Clinical Impression Statement Pt continues with numbness in bilat LEs. She continues with increased mm spasticity in bilat glutes and piriformis. Difficulty with SLS on Lt LE, good balance on Rt LE. HEP updated    PT Next Visit Plan core and LE strengthening, self massage/manual as needed    PT Home Exercise Plan Access Code [email or web address]    Consulted and Agree with Plan of Care Patient             Patient will benefit from skilled therapeutic intervention in order to improve the following deficits and impairments:     Visit Diagnosis: Muscle weakness (generalized)  Other disturbances of skin sensation  Paresthesia of skin     Problem List Patient Active Problem List   Diagnosis Date Noted   Epigastric pain 02/09/2021    Myalgia 02/09/2021   Arthralgia 02/09/2021   Neuropathy 02/09/2021   History of chemotherapy 02/09/2021   Lumbar radiculopathy 02/09/2021   Bilateral shoulder pain 10/12/2019   Facet hypertrophy of lumbar region 10/12/2018   Cough 06/02/2017   Rectal bleeding 06/02/2017   Malignant neoplasm of exocervix ([place]) 03/25/2016   Neoplasm of female genital organ 10/27/2014   Abnormal Papanicolaou smear of cervix with positive human papilloma virus (HPV) test 10/27/2014   Pre-diabetes 06/23/2013   Polyp of colon, adenomatous 03/19/2013   Abnormal mammogram 02/28/2013    Wynonia Medero, PT 02/28/2021, 11:46 AM  [facility] [place], [place], [address] Phone: [phone]   Fax:  [phone]  Name: Haley Francis MRN: [id number] Date of Birth: [birth year]

## 2021-02-28 NOTE — Progress Notes (Signed)
Osteopenic, low bone mass. Make sure you stay on vitamin D and calcium to prevent progression into osteoporosis. Recheck in 2 years.

## 2021-02-28 NOTE — Progress Notes (Signed)
Normal mammogram. Follow up in 1 year.

## 2021-03-02 ENCOUNTER — Other Ambulatory Visit: Payer: Self-pay

## 2021-03-02 ENCOUNTER — Ambulatory Visit: Payer: Medicare Other | Admitting: Physical Therapy

## 2021-03-02 DIAGNOSIS — R208 Other disturbances of skin sensation: Secondary | ICD-10-CM | POA: Diagnosis not present

## 2021-03-02 DIAGNOSIS — M6281 Muscle weakness (generalized): Secondary | ICD-10-CM

## 2021-03-02 DIAGNOSIS — R202 Paresthesia of skin: Secondary | ICD-10-CM | POA: Diagnosis not present

## 2021-03-02 DIAGNOSIS — M62838 Other muscle spasm: Secondary | ICD-10-CM | POA: Diagnosis not present

## 2021-03-02 NOTE — Therapy (Signed)
[place], [place], [address] Phone: [phone]   Fax:  [phone]  Physical Therapy Treatment  Patient Details  Name: Haley Francis MRN: [id number] Date of Birth: 1955-12-23 Referring Provider (PT): Donella Stade, [place]   Encounter Date: 03/02/2021   PT End of Session - 03/02/21 1138     Visit Number 5    Number of Visits 12    Date for PT Re-Evaluation 03/26/21    Authorization Type UHC Medicare    Authorization - Visit Number 5    Progress Note Due on Visit 10    PT Start Time 1057    PT Stop Time 1136    PT Time Calculation (min) 39 min    Activity Tolerance Patient tolerated treatment well    Behavior During Therapy [facility] for tasks assessed/performed             No past medical history on file.  No past surgical history on file.  There were no vitals filed for this visit.   Subjective Assessment - 03/02/21 1057     Subjective Pt states she feels "much better". States she still has numbness in LEs    Patient Stated Goals Improve numbness    Currently in Pain? No/denies                               [facility] Adult PT Treatment/Exercise - 03/02/21 0001       Lumbar Exercises: Stretches   Passive Hamstring Stretch Right;Left;2 reps;30 seconds    Single Knee to Chest Stretch Right;Left;2 reps;30 seconds    Figure 4 Stretch 2 reps;30 seconds    Other Lumbar Stretch Exercise Soleus stretch x 30 sec each leg in standing      Lumbar Exercises: Aerobic   Nustep L5 x 5 min for warm up      Lumbar Exercises: Standing   Heel Raises 20 reps    Heel Raises Limitations with ball squeeze on 4'' step    Other Standing Lumbar Exercises rocker board A/P x 2 min, tandem stance on foam 2 x 30 sec, SLS on foam 15 sec Rt, 10 sec Lt      Lumbar Exercises: Sidelying   Clam 20 reps    Clam Limitations with ball between ankles      Manual Therapy   Manual therapy comments  skilled assesment to assess effects of dry needling    Soft tissue mobilization STM bilat glues and piriformis              Trigger Point Dry Needling - 03/02/21 0001     Consent Given? Yes    Education Handout Provided Previously provided    Gluteus Medius Response Palpable increased muscle length    Gluteus Maximus Response Palpable increased muscle length                        PT Long Term Goals - 02/12/21 1703       PT LONG TERM GOAL #1   Title Patient to be independent with advanced HEP.    Time 6    Period Weeks    Status New    Target Date 03/26/21      PT LONG TERM GOAL #2   Title Patient to demonstrate B LE strength >/=4+/5.    Baseline 4-/5 bilat LEs    Time 6  Period Weeks    Status New    Target Date 03/26/21      PT LONG TERM GOAL #3   Title Pt will report >50% decrease in her N/T in bilat LEs    Time 6    Period Weeks    Status New    Target Date 03/26/21                   Plan - 03/02/21 1138     Clinical Impression Statement Pt reports she is feeling "much better". Pt with improved tolerance to SLS on Lt LE today. Dry needling to glutes to reduce mm tightness with good response    PT Next Visit Plan core and LE strengthening, self massage/manual as needed    PT Home Exercise Plan Access Code [email or web address]    Consulted and Agree with Plan of Care Patient             Patient will benefit from skilled therapeutic intervention in order to improve the following deficits and impairments:     Visit Diagnosis: Muscle weakness (generalized)  Other disturbances of skin sensation  Paresthesia of skin     Problem List Patient Active Problem List   Diagnosis Date Noted   Osteopenia 02/28/2021   Epigastric pain 02/09/2021   Myalgia 02/09/2021   Arthralgia 02/09/2021   Neuropathy 02/09/2021   History of chemotherapy 02/09/2021   Lumbar radiculopathy 02/09/2021   Bilateral shoulder pain 10/12/2019   Facet  hypertrophy of lumbar region 10/12/2018   Cough 06/02/2017   Rectal bleeding 06/02/2017   Malignant neoplasm of exocervix ([place]) 03/25/2016   Neoplasm of female genital organ 10/27/2014   Abnormal Papanicolaou smear of cervix with positive human papilloma virus (HPV) test 10/27/2014   Pre-diabetes 06/23/2013   Polyp of colon, adenomatous 03/19/2013   Abnormal mammogram 02/28/2013    Tzvi Economou, PT 03/02/2021, 11:42 AM  [facility] [place], [place], [address] Phone: [phone]   Fax:  [phone]  Name: Haley Francis MRN: [id number] Date of Birth: [birth year]

## 2021-03-05 ENCOUNTER — Ambulatory Visit: Payer: Medicare Other | Admitting: Physical Therapy

## 2021-03-07 ENCOUNTER — Encounter: Payer: Medicare Other | Admitting: Physical Therapy

## 2021-03-08 ENCOUNTER — Ambulatory Visit: Payer: Medicare Other | Admitting: Physical Therapy

## 2021-03-08 ENCOUNTER — Encounter: Payer: Self-pay | Admitting: Physical Therapy

## 2021-03-08 ENCOUNTER — Other Ambulatory Visit: Payer: Self-pay

## 2021-03-08 DIAGNOSIS — M6281 Muscle weakness (generalized): Secondary | ICD-10-CM

## 2021-03-08 DIAGNOSIS — R202 Paresthesia of skin: Secondary | ICD-10-CM

## 2021-03-08 DIAGNOSIS — R208 Other disturbances of skin sensation: Secondary | ICD-10-CM

## 2021-03-08 DIAGNOSIS — M62838 Other muscle spasm: Secondary | ICD-10-CM | POA: Diagnosis not present

## 2021-03-08 NOTE — Therapy (Signed)
[place], [place], [address] Phone: [phone]   Fax:  [phone]  Physical Therapy Treatment  Patient Details  Name: Haley Francis MRN: [id number] Date of Birth: 1955/03/04 Referring Provider (PT): Donella Stade, [place]   Encounter Date: 03/08/2021   PT End of Session - 03/08/21 1106     Visit Number 6    Number of Visits 12    Date for PT Re-Evaluation 03/26/21    Authorization Type UHC Medicare    Authorization - Visit Number 6    Progress Note Due on Visit 10    PT Start Time 1104    PT Stop Time 1142    PT Time Calculation (min) 38 min             History reviewed. No pertinent past medical history.  History reviewed. No pertinent surgical history.  There were no vitals filed for this visit.   Subjective Assessment - 03/08/21 1108     Subjective Pt reports her LE numbness/pain is "much better".  Continues to be constant, but the intensity is 50% less.    Patient is accompained by: Family member   daughter   Patient Stated Goals Improve numbness    Currently in Pain? Yes    Pain Score 3     Pain Location Back    Pain Orientation Right;Lower    Pain Descriptors / Indicators Aching    Aggravating Factors  ?                [facility] PT Assessment - 03/08/21 0001       Assessment   Medical Diagnosis G62.9 (ICD-10-CM) - Neuropathy  M54.16 (ICD-10-CM) - Lumbar radiculopathy    Referring Provider (PT) Alden Hipp, Jade L, PA-C    Onset Date/Surgical Date --   ~2 months ago   Hand Dominance Right    Prior Therapy 2 years ago for back      Strength   Right Hip Flexion 5/5    Right Hip Extension 4+/5    Right Hip ABduction 4-/5    Left Hip Flexion 5/5    Left Hip Extension 4/5    Left Hip ABduction 4-/5    Right Ankle Inversion 4+/5    Right Ankle Eversion 5/5    Left Ankle Inversion 4+/5    Left Ankle Eversion 5/5              [facility] Adult PT Treatment/Exercise -  03/08/21 0001       Lumbar Exercises: Stretches   Piriformis Stretch Right;Left;2 reps;30 seconds   seated, knee to opp shoulder   Gastroc Stretch Right;Left;1 rep;30 seconds    Gastroc Stretch Limitations Soleus stretch x 30 sec each leg in standing    Other Lumbar Stretch Exercise Fibularis nerve glides in long sitting x 10 each leg      Lumbar Exercises: Aerobic   Recumbent Bike L1: 5 min for warm up      Lumbar Exercises: Standing   Heel Raises 20 reps    Other Standing Lumbar Exercises side step with green TB around feet x Rt/Lt 10 x 3 sets      Lumbar Exercises: Sidelying   Other Sidelying Lumbar Exercises clam position with knee and toe taps x 15 each leg.      Manual Therapy   Soft tissue mobilization STM bilat glues and piriformis, down to bilat fibularis  PT Long Term Goals - 03/08/21 1149       PT LONG TERM GOAL #1   Title Patient to be independent with advanced HEP.    Time 6    Period Weeks    Status On-going    Target Date 03/26/21      PT LONG TERM GOAL #2   Title Patient to demonstrate B LE strength >/=4+/5.    Time 6    Period Weeks    Status Partially Met    Target Date 03/26/21      PT LONG TERM GOAL #3   Title Pt will report >50% decrease in her N/T in bilat LEs    Time 6    Period Weeks    Status Partially Met    Target Date 03/26/21                   Plan - 03/08/21 1229     Clinical Impression Statement Pt demonstrated improved ankle and hip strength.  Pt reporting overall reduction of intensity of symptoms by 50%; has partially met LTG#3.  She tolerated exercises well without increase in symptoms.  Progressing well towards remaining goals.    Rehab Potential Good    PT Frequency 2x / week    PT Duration 6 weeks    PT Treatment/Interventions ADLs/Self Care Home Management;Electrical Stimulation;Moist Heat;Traction;Cryotherapy;Gait training;Stair training;Functional mobility training;Therapeutic  activities;Neuromuscular re-education;Balance training;Therapeutic exercise;Patient/family education;Manual techniques;Passive range of motion;Dry needling;Taping;Joint Manipulations    PT Next Visit Plan core and LE strengthening, manual as needed    PT Home Exercise Plan Access Code [email or web address]    Consulted and Agree with Plan of Care Patient;Family member/caregiver    Family Member Consulted Daughter             Patient will benefit from skilled therapeutic intervention in order to improve the following deficits and impairments:  Increased fascial restricitons, Increased muscle spasms, Decreased skin integrity, Impaired sensation, Decreased strength  Visit Diagnosis: Muscle weakness (generalized)  Other disturbances of skin sensation  Paresthesia of skin     Problem List Patient Active Problem List   Diagnosis Date Noted   Osteopenia 02/28/2021   Epigastric pain 02/09/2021   Myalgia 02/09/2021   Arthralgia 02/09/2021   Neuropathy 02/09/2021   History of chemotherapy 02/09/2021   Lumbar radiculopathy 02/09/2021   Bilateral shoulder pain 10/12/2019   Facet hypertrophy of lumbar region 10/12/2018   Cough 06/02/2017   Rectal bleeding 06/02/2017   Malignant neoplasm of exocervix ([place]) 03/25/2016   Neoplasm of female genital organ 10/27/2014   Abnormal Papanicolaou smear of cervix with positive human papilloma virus (HPV) test 10/27/2014   Pre-diabetes 06/23/2013   Polyp of colon, adenomatous 03/19/2013   Abnormal mammogram 02/28/2013   Kerin Perna, PTA 03/08/21 12:38 PM  [place], [place], [address] Phone: [phone]   Fax:  [phone]  Name: Haley Francis MRN: [id number] Date of Birth: 08/25/1955

## 2021-03-13 ENCOUNTER — Other Ambulatory Visit: Payer: Self-pay

## 2021-03-13 ENCOUNTER — Ambulatory Visit: Payer: Medicare Other | Admitting: Physical Therapy

## 2021-03-13 DIAGNOSIS — R208 Other disturbances of skin sensation: Secondary | ICD-10-CM

## 2021-03-13 DIAGNOSIS — M62838 Other muscle spasm: Secondary | ICD-10-CM

## 2021-03-13 DIAGNOSIS — M6281 Muscle weakness (generalized): Secondary | ICD-10-CM | POA: Diagnosis not present

## 2021-03-13 DIAGNOSIS — R202 Paresthesia of skin: Secondary | ICD-10-CM | POA: Diagnosis not present

## 2021-03-13 NOTE — Therapy (Signed)
[place], [place], [address] Phone: [phone]   Fax:  [phone]  Physical Therapy Treatment and Discharge  Patient Details  Name: Haley Francis MRN: [id number] Date of Birth: 18-Aug-1955 Referring Provider (PT): Donella Stade, PA-C  PHYSICAL THERAPY DISCHARGE SUMMARY  Visits from Start of Care: 7  Current functional level related to goals / functional outcomes: See below   Remaining deficits: See below   Education / Equipment: See below   Patient agrees to discharge. Patient goals were met. Patient is being discharged due to being pleased with the current functional level.   Encounter Date: 03/13/2021   PT End of Session - 03/13/21 1101     Visit Number 7    Number of Visits 12    Date for PT Re-Evaluation 03/26/21    Authorization Type UHC Medicare    Authorization - Visit Number 7    Progress Note Due on Visit 10    PT Start Time 1101    PT Stop Time 1145    PT Time Calculation (min) 44 min    Activity Tolerance Patient tolerated treatment well    Behavior During Therapy [facility] for tasks assessed/performed             No past medical history on file.  No past surgical history on file.  There were no vitals filed for this visit.   Subjective Assessment - 03/13/21 1106     Subjective Pt states she only feels a "little numbness" now. Pt reports N/T will stop and start again -- decreasing overall frequency. She reports performing her exercises daily.    Patient is accompained by: Family member   daughter   Limitations Standing;Walking;Sitting    How long can you sit comfortably? No issues    How long can you stand comfortably? No issues    How long can you walk comfortably? No issues    Patient Stated Goals Improve numbness    Currently in Pain? No/denies    Pain Location Back    Pain Orientation Right;Lower    Pain Descriptors / Indicators Aching                                [facility] Adult PT Treatment/Exercise - 03/13/21 0001       Lumbar Exercises: Stretches   Piriformis Stretch Right;Left;2 reps;30 seconds    Gastroc Stretch Right;Left;1 rep;30 seconds    Gastroc Stretch Limitations Soleus stretch x 30 sec each leg in standing    Other Lumbar Stretch Exercise ant tib stretch x30 sec      Lumbar Exercises: Aerobic   Recumbent Bike L2: 5 min for warm up      Lumbar Exercises: Standing   Heel Raises 20 reps    Heel Raises Limitations single leg    Other Standing Lumbar Exercises SLS 2x20 sec each leg    Other Standing Lumbar Exercises side step with green TB around feet x Rt/Lt 10', blue TB around feet 10'      Lumbar Exercises: Seated   Other Seated Lumbar Exercises Ankle DF+ eversion red tband 2x10      Lumbar Exercises: Sidelying   Other Sidelying Lumbar Exercises clam position with knee and toe taps x 20 red tband                          PT  Long Term Goals - 03/13/21 1130       PT LONG TERM GOAL #1   Title Patient to be independent with advanced HEP.    Time 6    Period Weeks    Status Achieved    Target Date 03/26/21      PT LONG TERM GOAL #2   Title Patient to demonstrate B LE strength >/=4+/5.    Time 6    Period Weeks    Status Achieved    Target Date 03/26/21      PT LONG TERM GOAL #3   Title Pt will report >50% decrease in her N/T in bilat LEs    Time 6    Period Weeks    Status Achieved    Target Date 03/26/21                   Plan - 03/13/21 1143     Clinical Impression Statement Pt reports her numbness is now "just a little bit" sometimes. She will have times with no symptoms at all. Less muscle tautness noted throughout bilat LEs compared to initial evaluation. Pt with increased strength and understanding of her final HEP. Pt feels ready for PT d/c. Did not feel any need for any TPDN.    Personal Factors and Comorbidities Age;Time since onset of  injury/illness/exacerbation;Past/Current Experience;Other    Examination-Activity Limitations Stand;Squat    Rehab Potential Good    PT Frequency 2x / week    PT Duration 6 weeks    PT Treatment/Interventions ADLs/Self Care Home Management;Electrical Stimulation;Moist Heat;Traction;Cryotherapy;Gait training;Stair training;Functional mobility training;Therapeutic activities;Neuromuscular re-education;Balance training;Therapeutic exercise;Patient/family education;Manual techniques;Passive range of motion;Dry needling;Taping;Joint Manipulations    PT Next Visit Plan core and LE strengthening, manual as needed    PT Home Exercise Plan Access Code [email or web address]    Consulted and Agree with Plan of Care Patient;Family member/caregiver    Family Member Consulted Daughter             Patient will benefit from skilled therapeutic intervention in order to improve the following deficits and impairments:  Increased fascial restricitons, Increased muscle spasms, Decreased skin integrity, Impaired sensation, Decreased strength  Visit Diagnosis: Muscle weakness (generalized)  Other disturbances of skin sensation  Paresthesia of skin  Other muscle spasm     Problem List Patient Active Problem List   Diagnosis Date Noted   Osteopenia 02/28/2021   Epigastric pain 02/09/2021   Myalgia 02/09/2021   Arthralgia 02/09/2021   Neuropathy 02/09/2021   History of chemotherapy 02/09/2021   Lumbar radiculopathy 02/09/2021   Bilateral shoulder pain 10/12/2019   Facet hypertrophy of lumbar region 10/12/2018   Cough 06/02/2017   Rectal bleeding 06/02/2017   Malignant neoplasm of exocervix ([place]) 03/25/2016   Neoplasm of female genital organ 10/27/2014   Abnormal Papanicolaou smear of cervix with positive human papilloma virus (HPV) test 10/27/2014   Pre-diabetes 06/23/2013   Polyp of colon, adenomatous 03/19/2013   Abnormal mammogram 02/28/2013    [facility] April Gordy Levan, PT, DPT 03/13/2021, 11:46  AM  [facility] [place], [place], [address] Phone: [phone]   Fax:  [phone]  Name: Haley Francis MRN: [id number] Date of Birth: Jan 23, 1955

## 2021-03-16 ENCOUNTER — Ambulatory Visit: Payer: Medicare Other | Admitting: Physical Therapy

## 2021-03-19 ENCOUNTER — Ambulatory Visit: Payer: Medicare Other | Admitting: Physical Therapy

## 2021-04-09 ENCOUNTER — Encounter: Payer: Self-pay | Admitting: Physician Assistant

## 2021-04-11 MED ORDER — SCOPOLAMINE 1 MG/3DAYS TD PT72: 1.0000 | MEDICATED_PATCH | TRANSDERMAL | 1 refills | Status: DC

## 2021-04-11 NOTE — Telephone Encounter (Signed)
Need to low how long she is going to be going so I have a better estimate of how many patches to send. ?

## 2021-06-28 ENCOUNTER — Encounter: Payer: Self-pay | Admitting: Physician Assistant

## 2021-06-29 MED ORDER — BENZONATATE 200 MG PO CAPS: 200.0000 mg | ORAL_CAPSULE | Freq: Three times a day (TID) | ORAL | 0 refills | Status: DC | PRN

## 2021-08-16 ENCOUNTER — Other Ambulatory Visit: Payer: Self-pay | Admitting: Family Medicine

## 2021-08-23 ENCOUNTER — Other Ambulatory Visit: Payer: Self-pay | Admitting: Family Medicine

## 2021-09-05 ENCOUNTER — Encounter: Payer: Self-pay | Admitting: General Practice

## 2021-09-08 ENCOUNTER — Other Ambulatory Visit: Payer: Self-pay | Admitting: Physician Assistant

## 2021-09-08 DIAGNOSIS — R1013 Epigastric pain: Secondary | ICD-10-CM

## 2022-01-20 ENCOUNTER — Other Ambulatory Visit: Payer: Self-pay | Admitting: Physician Assistant

## 2022-01-25 ENCOUNTER — Other Ambulatory Visit: Payer: Self-pay | Admitting: Physician Assistant

## 2022-02-19 ENCOUNTER — Encounter: Payer: 59 | Admitting: Physician Assistant

## 2022-02-26 ENCOUNTER — Encounter: Payer: 59 | Admitting: Physician Assistant

## 2022-03-29 ENCOUNTER — Other Ambulatory Visit: Payer: Self-pay | Admitting: Physician Assistant

## 2022-03-29 DIAGNOSIS — R1013 Epigastric pain: Secondary | ICD-10-CM

## 2022-03-31 ENCOUNTER — Other Ambulatory Visit: Payer: Self-pay | Admitting: Physician Assistant

## 2022-03-31 DIAGNOSIS — G629 Polyneuropathy, unspecified: Secondary | ICD-10-CM

## 2022-03-31 DIAGNOSIS — Z9221 Personal history of antineoplastic chemotherapy: Secondary | ICD-10-CM

## 2022-04-08 ENCOUNTER — Telehealth: Payer: Self-pay | Admitting: Physician Assistant

## 2022-04-08 NOTE — Telephone Encounter (Signed)
Contacted Haley Francis to schedule their annual wellness visit. Call back at later date: Patient stated would call back.  [name] Patient Arts administrator II Direct Dial: [phone]

## 2022-04-09 ENCOUNTER — Ambulatory Visit (INDEPENDENT_AMBULATORY_CARE_PROVIDER_SITE_OTHER): Payer: 59 | Admitting: Physician Assistant

## 2022-04-09 VITALS — BP 136/74 | HR 80 | Ht 59.0 in | Wt 104.0 lb

## 2022-04-09 DIAGNOSIS — R202 Paresthesia of skin: Secondary | ICD-10-CM

## 2022-04-09 DIAGNOSIS — Z1329 Encounter for screening for other suspected endocrine disorder: Secondary | ICD-10-CM | POA: Diagnosis not present

## 2022-04-09 DIAGNOSIS — Z780 Asymptomatic menopausal state: Secondary | ICD-10-CM | POA: Insufficient documentation

## 2022-04-09 DIAGNOSIS — M79604 Pain in right leg: Secondary | ICD-10-CM | POA: Insufficient documentation

## 2022-04-09 DIAGNOSIS — Z131 Encounter for screening for diabetes mellitus: Secondary | ICD-10-CM | POA: Diagnosis not present

## 2022-04-09 DIAGNOSIS — Z0000 Encounter for general adult medical examination without abnormal findings: Secondary | ICD-10-CM | POA: Diagnosis not present

## 2022-04-09 DIAGNOSIS — M79605 Pain in left leg: Secondary | ICD-10-CM | POA: Diagnosis not present

## 2022-04-09 DIAGNOSIS — M6281 Muscle weakness (generalized): Secondary | ICD-10-CM | POA: Diagnosis not present

## 2022-04-09 DIAGNOSIS — Z23 Encounter for immunization: Secondary | ICD-10-CM | POA: Diagnosis not present

## 2022-04-09 DIAGNOSIS — Z13220 Encounter for screening for lipoid disorders: Secondary | ICD-10-CM | POA: Diagnosis not present

## 2022-04-09 DIAGNOSIS — E781 Pure hyperglyceridemia: Secondary | ICD-10-CM | POA: Diagnosis not present

## 2022-04-09 NOTE — Progress Notes (Signed)
Complete physical exam  Patient: Haley Francis   DOB: 1955/12/22   67 y.o. Female  MRN: [id number]  Subjective:    Chief Complaint  Patient presents with   Annual Exam    Haley Francis is a 67 y.o. female who presents today for a complete physical exam. She reports consuming a general diet.  Pt walks regularly  She generally feels well. She reports sleeping well. She does have additional problems to discuss today.   She is having her bilateral leg ache and paresthesia of her feet. She denies any back pain. Not worse when walking but only when at rest. She does feel like worse at bedtime. PT worked well in the past and would like to have it done again.   Most recent fall risk assessment:    04/09/2022    9:38 AM  [place] in the past year? 0  Number falls in past yr: 0  Injury with Fall? 0  Risk for fall due to : No Fall Risks  Follow up Falls evaluation completed     Most recent depression screenings:    04/09/2022    9:38 AM 08/14/2020   11:18 AM  PHQ 2/9 Scores  PHQ - 2 Score 0 0    Vision:Not within last year  and Dental: No current dental problems and Receives regular dental care  Patient Active Problem List   Diagnosis Date Noted   Bilateral leg pain 04/09/2022   Post-menopausal 04/09/2022   Osteopenia 02/28/2021   Epigastric pain 02/09/2021   Myalgia 02/09/2021   Arthralgia 02/09/2021   Neuropathy 02/09/2021   History of chemotherapy 02/09/2021   Lumbar radiculopathy 02/09/2021   Bilateral shoulder pain 10/12/2019   Facet hypertrophy of lumbar region 10/12/2018   Cough 06/02/2017   Rectal bleeding 06/02/2017   Malignant neoplasm of exocervix ([place]) 03/25/2016   Neoplasm of female genital organ 10/27/2014   Abnormal Papanicolaou smear of cervix with positive human papilloma virus (HPV) test 10/27/2014   Pre-diabetes 06/23/2013   Polyp of colon, adenomatous 03/19/2013   Abnormal mammogram 02/28/2013   No past medical history on file. No past surgical  history on file. Family Status  Relation Name Status   Mother  (Not Specified)   Family History  Problem Relation Age of Onset   Hypertension Mother    Diabetes Mother    Hyperlipidemia Mother    No Known Allergies    Patient Care Team: Lavada Mesi as PCP - General (Family Medicine)   Outpatient Medications Prior to Visit  Medication Sig   acetaminophen (TYLENOL) 500 MG tablet Take 500 mg by mouth as needed.   calcium carbonate (TUMS - DOSED IN MG ELEMENTAL CALCIUM) 500 MG chewable tablet Chew 1 tablet by mouth as needed for indigestion or heartburn.   L-Methylfolate-B6-B12 (FOLTANX) 3-35-2 MG TABS Take 1 tablet by mouth twice daily   omeprazole (PRILOSEC) 40 MG capsule Take 1 capsule (40 mg total) by mouth daily. NEEDS APPT   scopolamine (TRANSDERM-SCOP) 1 MG/3DAYS APPLY 1 PATCH TOPICALLY EVERY 3 DAYS .  PLACE  ON  ABOUT  4  HOURS  BEFORE  GOING  OUT  TO  [place]   [DISCONTINUED] benzonatate (TESSALON) 200 MG capsule Take 1 capsule (200 mg total) by mouth 3 (three) times daily as needed.   No facility-administered medications prior to visit.    ROS   See HPI.      Objective:     BP 136/74   Pulse  80   Ht 4\' 11"  (1.499 m)   Wt 104 lb (47.2 kg)   SpO2 100%   BMI 21.01 kg/m  BP Readings from Last 3 Encounters:  04/09/22 136/74  02/07/21 114/62  08/14/20 119/77   Wt Readings from Last 3 Encounters:  04/09/22 104 lb (47.2 kg)  02/07/21 103 lb (46.7 kg)  08/14/20 102 lb 1.9 oz (46.3 kg)      Physical Exam     BP 136/74   Pulse 80   Ht 4\' 11"  (1.499 m)   Wt 104 lb (47.2 kg)   SpO2 100%   BMI 21.01 kg/m   General Appearance:    Alert, cooperative, no distress, appears stated age  Head:    Normocephalic, without obvious abnormality, atraumatic  Eyes:    PERRL, conjunctiva/corneas clear, EOM's intact, fundi    benign, both eyes  Ears:    Normal TM's and external ear canals, both ears  Nose:   Nares normal, septum midline, mucosa normal, no  drainage    or sinus tenderness  Throat:   Lips, mucosa, and tongue normal; teeth and gums normal  Neck:   Supple, symmetrical, trachea midline, no adenopathy;    thyroid:  no enlargement/tenderness/nodules; no carotid   bruit or JVD  Back:     Symmetric, no curvature, ROM normal, no CVA tenderness  Lungs:     Clear to auscultation bilaterally, respirations unlabored  Chest Wall:    No tenderness or deformity   Heart:    Regular rate and rhythm, S1 and S2 normal, no murmur, rub   or gallop     Abdomen:     Soft, non-tender, bowel sounds active all four quadrants,    no masses, no organomegaly        Extremities:   Extremities normal, atraumatic, no cyanosis or edema   Pulses:   2+ and symmetric all extremities  Skin:   Skin color, texture, turgor normal, no rashes or lesions  Lymph nodes:   Cervical, supraclavicular, and axillary nodes normal  Neurologic:   CNII-XII intact, normal strength, sensation and reflexes    throughout   Assessment & Plan:    Routine Health Maintenance and Physical Exam  Immunization History  Administered Date(s) Administered   Influenza Split 10/15/2010   Janssen (J&J) SARS-COV-2 Vaccination 03/28/2019, 12/01/2019   PNEUMOCOCCAL CONJUGATE-20 04/09/2022   Tdap 02/17/2013    Health Maintenance  Topic Date Due   Medicare Annual Wellness (AWV)  08/14/2021   MAMMOGRAM  02/28/2022   Zoster Vaccines- Shingrix (1 of 2) 07/10/2022 (Originally 06/08/1973)   INFLUENZA VACCINE  04/09/2023 (Originally 08/14/2021)   COVID-19 Vaccine (3 - 2023-24 season) 04/09/2023 (Originally 09/14/2021)   COLONOSCOPY (Pts 45-61yrs Insurance coverage will need to be confirmed)  09/11/2022   DTaP/Tdap/Td (2 - Td or Tdap) 02/18/2023   Pneumonia Vaccine 61+ Years old  Completed   DEXA SCAN  Completed   Hepatitis C Screening  Completed   HPV VACCINES  Aged Out    Discussed health benefits of physical activity, and encouraged her to engage in regular exercise appropriate for her age  and condition.  Marland KitchenTwanna Francis was seen today for annual exam.  Diagnoses and all orders for this visit:  Routine physical examination -     TSH -     Lipid Panel w/reflex Direct LDL -     COMPLETE METABOLIC PANEL WITH GFR -     CBC with Differential/Platelet  Diabetes mellitus screening -     COMPLETE  METABOLIC PANEL WITH GFR  Lipid screening -     Lipid Panel w/reflex Direct LDL  Thyroid disorder screen -     TSH  Post-menopausal -     VITAMIN D 25 Hydroxy (Vit-D Deficiency, Fractures)  Need for pneumococcal vaccination -     Pneumococcal conjugate vaccine 20-valent (Prevnar 20)  Bilateral leg pain -     Ambulatory referral to Physical Therapy  Muscle weakness (generalized) -     Ambulatory referral to Physical Therapy  Paresthesia of skin -     Ambulatory referral to Physical Therapy   .Marland Kitchen Discussed 150 minutes of exercise a week.  Encouraged vitamin D 1000 units and Calcium 1300mg  or 4 servings of dairy a day.  PHQ no concerns Fasting labs ordered Pap not needed Per patient had mammogram done Pneumonia vaccine given today Discussed shingrix and RSV Declined covid boosters  Will refer to PT again for dry needling] Consider starting magnesium 400mg  at bedtime I think there could also be some RLS component HO given Referral to PT for dry needling that helped before      Iran Planas, PA-C

## 2022-04-09 NOTE — Patient Instructions (Addendum)
Will order PT again.  Consider magnesium 400mg  at bedtime  Restless Legs Syndrome Restless legs syndrome is a condition that causes uncomfortable feelings or sensations in the legs, especially while sitting or lying down. The sensations usually cause an overwhelming urge to move the legs. The arms can also sometimes be affected. The condition can range from mild to severe. The symptoms often interfere with a person's ability to sleep. What are the causes? The cause of this condition is not known. What increases the risk? The following factors may make you more likely to develop this condition: Being older than 50. Pregnancy. Being a woman. In general, the condition is more common in women than in men. A family history of the condition. Having iron deficiency. Overuse of caffeine, nicotine, or alcohol. Certain medical conditions, such as kidney disease, Parkinson's disease, or nerve damage. Certain medicines, such as those for high blood pressure, nausea, colds, allergies, depression, and some heart conditions. What are the signs or symptoms? The main symptom of this condition is uncomfortable sensations in the legs, such as: Pulling. Tingling. Prickling. Throbbing. Crawling. Burning. Usually, the sensations: Affect both sides of the body. Are worse when you sit or lie down. Are worse at night. These may make it difficult to fall asleep. Make you have a strong urge to move your legs. Are temporarily relieved by moving your legs or standing. The arms can also be affected, but this is rare. People who have this condition often have tiredness during the day because of their lack of sleep at night. How is this diagnosed? This condition may be diagnosed based on: Your symptoms. Blood tests. In some cases, you may be monitored in a sleep lab by a specialist (a sleep study). This can detect any disruptions in your sleep. How is this treated? This condition is treated by managing the  symptoms. This may include: Lifestyle changes, such as exercising, using relaxation techniques, and avoiding caffeine, alcohol, or tobacco. Iron supplements. Medicines. Parkinson's medications may be tried first. Anti-seizure medications can also be helpful. Follow these instructions at home: General instructions Take over-the-counter and prescription medicines only as told by your health care provider. Use methods to help relieve the uncomfortable sensations, such as: Massaging your legs. Walking or stretching. Taking a cold or hot bath. Keep all follow-up visits. This is important. Lifestyle     Practice good sleep habits. For example, go to bed and get up at the same time every day. Most adults should get 7-9 hours of sleep each night. Exercise regularly. Try to get at least 30 minutes of exercise most days of the week. Practice ways of relaxing, such as yoga or meditation. Avoid caffeine and alcohol. Do not use any products that contain nicotine or tobacco. These products include cigarettes, chewing tobacco, and vaping devices, such as e-cigarettes. If you need help quitting, ask your health care provider. Where to find more information [employer] of Neurological Disorders and Stroke: [employer] Contact a health care provider if: Your symptoms get worse or they do not improve with treatment. Summary Restless legs syndrome is a condition that causes uncomfortable feelings or sensations in the legs, especially while sitting or lying down. The symptoms often interfere with your ability to sleep. This condition is treated by managing the symptoms. You may need to make lifestyle changes or take medicines. This information is not intended to replace advice given to you by your health care provider. Make sure you discuss any questions you have with your health  care provider. Document Revised: 08/13/2020 Document Reviewed: 08/13/2020 Elsevier Patient Education  [place] Maintenance After Age 69 After age 79, you are at a higher risk for certain long-term diseases and infections as well as injuries from falls. Falls are a major cause of broken bones and head injuries in people who are older than age 27. Getting regular preventive care can help to keep you healthy and well. Preventive care includes getting regular testing and making lifestyle changes as recommended by your health care provider. Talk with your health care provider about: Which screenings and tests you should have. A screening is a test that checks for a disease when you have no symptoms. A diet and exercise plan that is right for you. What should I know about screenings and tests to prevent falls? Screening and testing are the best ways to find a health problem early. Early diagnosis and treatment give you the best chance of managing medical conditions that are common after age 2. Certain conditions and lifestyle choices may make you more likely to have a fall. Your health care provider may recommend: Regular vision checks. Poor vision and conditions such as cataracts can make you more likely to have a fall. If you wear glasses, make sure to get your prescription updated if your vision changes. Medicine review. Work with your health care provider to regularly review all of the medicines you are taking, including over-the-counter medicines. Ask your health care provider about any side effects that may make you more likely to have a fall. Tell your health care provider if any medicines that you take make you feel dizzy or sleepy. Strength and balance checks. Your health care provider may recommend certain tests to check your strength and balance while standing, walking, or changing positions. Foot health exam. Foot pain and numbness, as well as not wearing proper footwear, can make you more likely to have a fall. Screenings, including: Osteoporosis screening. Osteoporosis is a  condition that causes the bones to get weaker and break more easily. Blood pressure screening. Blood pressure changes and medicines to control blood pressure can make you feel dizzy. Depression screening. You may be more likely to have a fall if you have a fear of falling, feel depressed, or feel unable to do activities that you used to do. Alcohol use screening. Using too much alcohol can affect your balance and may make you more likely to have a fall. Follow these instructions at home: Lifestyle Do not drink alcohol if: Your health care provider tells you not to drink. If you drink alcohol: Limit how much you have to: 0-1 drink a day for women. 0-2 drinks a day for men. Know how much alcohol is in your drink. In the [place] one drink equals one 12 oz bottle of beer (355 mL), one 5 oz glass of wine (148 mL), or one 1 oz glass of hard liquor (44 mL). Do not use any products that contain nicotine or tobacco. These products include cigarettes, chewing tobacco, and vaping devices, such as e-cigarettes. If you need help quitting, ask your health care provider. Activity  Follow a regular exercise program to stay fit. This will help you maintain your balance. Ask your health care provider what types of exercise are appropriate for you. If you need a cane or walker, use it as recommended by your health care provider. Wear supportive shoes that have nonskid soles. Safety  Remove any tripping hazards, such as rugs, cords, and clutter.  Install safety equipment such as grab bars in bathrooms and safety rails on stairs. Keep rooms and walkways well-lit. General instructions Talk with your health care provider about your risks for falling. Tell your health care provider if: You fall. Be sure to tell your health care provider about all falls, even ones that seem minor. You feel dizzy, tiredness (fatigue), or off-balance. Take over-the-counter and prescription medicines only as told by your health care  provider. These include supplements. Eat a healthy diet and maintain a healthy weight. A healthy diet includes low-fat dairy products, low-fat (lean) meats, and fiber from whole grains, beans, and lots of fruits and vegetables. Stay current with your vaccines. Schedule regular health, dental, and eye exams. Summary Having a healthy lifestyle and getting preventive care can help to protect your health and wellness after age 53. Screening and testing are the best way to find a health problem early and help you avoid having a fall. Early diagnosis and treatment give you the best chance for managing medical conditions that are more common for people who are older than age 23. Falls are a major cause of broken bones and head injuries in people who are older than age 72. Take precautions to prevent a fall at home. Work with your health care provider to learn what changes you can make to improve your health and wellness and to prevent falls. This information is not intended to replace advice given to you by your health care provider. Make sure you discuss any questions you have with your health care provider. Document Revised: 05/22/2020 Document Reviewed: 05/22/2020 Elsevier Patient Education  [place].

## 2022-04-10 ENCOUNTER — Other Ambulatory Visit: Payer: Self-pay | Admitting: Physician Assistant

## 2022-04-10 DIAGNOSIS — E7800 Pure hypercholesterolemia, unspecified: Secondary | ICD-10-CM | POA: Insufficient documentation

## 2022-04-10 DIAGNOSIS — E781 Pure hyperglyceridemia: Secondary | ICD-10-CM

## 2022-04-10 LAB — COMPLETE METABOLIC PANEL WITH GFR
AG Ratio: 1.3 (calc) (ref 1.0–2.5)
ALT: 23 U/L (ref 6–29)
AST: 25 U/L (ref 10–35)
Albumin: 4.3 g/dL (ref 3.6–5.1)
Alkaline phosphatase (APISO): 70 U/L (ref 37–153)
BUN: 12 mg/dL (ref 7–25)
CO2: 27 mmol/L (ref 20–32)
Calcium: 9.3 mg/dL (ref 8.6–10.4)
Chloride: 106 mmol/L (ref 98–110)
Creat: 0.75 mg/dL (ref 0.50–1.05)
Globulin: 3.2 g/dL (calc) (ref 1.9–3.7)
Glucose, Bld: 87 mg/dL (ref 65–99)
Potassium: 4.4 mmol/L (ref 3.5–5.3)
Sodium: 141 mmol/L (ref 135–146)
Total Bilirubin: 0.4 mg/dL (ref 0.2–1.2)
Total Protein: 7.5 g/dL (ref 6.1–8.1)
eGFR: 87 mL/min/{1.73_m2} (ref >=60)

## 2022-04-10 LAB — CBC WITH DIFFERENTIAL/PLATELET
Absolute Monocytes: 480 cells/uL (ref 200–950)
Basophils Absolute: 8 cells/uL (ref 0–200)
Basophils Relative: 0.1 %
Eosinophils Absolute: 168 cells/uL (ref 15–500)
Eosinophils Relative: 2.1 %
HCT: 39.2 % (ref 35.0–45.0)
Hemoglobin: 13 g/dL (ref 11.7–15.5)
Lymphs Abs: 1304 cells/uL (ref 850–3900)
MCH: 29.5 pg (ref 27.0–33.0)
MCHC: 33.2 g/dL (ref 32.0–36.0)
MCV: 89.1 fL (ref 80.0–100.0)
MPV: 9.6 fL (ref 7.5–12.5)
Monocytes Relative: 6 %
Neutro Abs: 6040 cells/uL (ref 1500–7800)
Neutrophils Relative %: 75.5 %
Platelets: 320 10*3/uL (ref 140–400)
RBC: 4.4 10*6/uL (ref 3.80–5.10)
RDW: 12.3 % (ref 11.0–15.0)
Total Lymphocyte: 16.3 %
WBC: 8 10*3/uL (ref 3.8–10.8)

## 2022-04-10 LAB — LIPID PANEL W/REFLEX DIRECT LDL
Cholesterol: 205 mg/dL — ABNORMAL HIGH (ref <200)
HDL: 57 mg/dL (ref >=50)
LDL Cholesterol (Calc): 112 mg/dL (calc) — ABNORMAL HIGH
Non-HDL Cholesterol (Calc): 148 mg/dL (calc) — ABNORMAL HIGH (ref <130)
Total CHOL/HDL Ratio: 3.6 (calc) (ref <5.0)
Triglycerides: 251 mg/dL — ABNORMAL HIGH (ref <150)

## 2022-04-10 LAB — VITAMIN D 25 HYDROXY (VIT D DEFICIENCY, FRACTURES): Vit D, 25-Hydroxy: 42 ng/mL (ref 30–100)

## 2022-04-10 LAB — TSH: TSH: 2.3 mIU/L (ref 0.40–4.50)

## 2022-04-10 NOTE — Progress Notes (Signed)
Gage,   Vitamin D in normal range. Make sure taking at least 1000 units a day with dairy.  Hemoglobin looks good.  Kidney, liver, glucose look good.  Thyroid looks good.   TG are elevated and LDL is not quite to goal. Your overall 10 year risk is over 7.5 percent. I would suggest a daily statin medication to decrease your CV risk. Would you be willing to try?    Marland KitchenMarland KitchenThe 10-year ASCVD risk score (Arnett DK, et al., 2019) is: 7.7%   Values used to calculate the score:     Age: 67 years     Sex: Female     Is Non-Hispanic African American: No     Diabetic: No     Tobacco smoker: No     Systolic Blood Pressure: [id number] mmHg     Is BP treated: No     HDL Cholesterol: 57 mg/dL     Total Cholesterol: 205 mg/dL

## 2022-04-21 ENCOUNTER — Encounter: Payer: Self-pay | Admitting: Physician Assistant

## 2022-04-23 MED ORDER — ATORVASTATIN CALCIUM 40 MG PO TABS: 40.0000 mg | ORAL_TABLET | Freq: Every day | ORAL | 3 refills | Status: DC

## 2022-04-24 ENCOUNTER — Other Ambulatory Visit: Payer: Self-pay | Admitting: Physician Assistant

## 2022-04-24 DIAGNOSIS — R1013 Epigastric pain: Secondary | ICD-10-CM

## 2022-05-24 ENCOUNTER — Other Ambulatory Visit: Payer: Self-pay | Admitting: Physician Assistant

## 2022-05-24 DIAGNOSIS — G629 Polyneuropathy, unspecified: Secondary | ICD-10-CM

## 2022-05-24 DIAGNOSIS — Z9221 Personal history of antineoplastic chemotherapy: Secondary | ICD-10-CM

## 2022-05-27 ENCOUNTER — Encounter: Payer: Self-pay | Admitting: Physician Assistant

## 2022-05-28 ENCOUNTER — Ambulatory Visit (INDEPENDENT_AMBULATORY_CARE_PROVIDER_SITE_OTHER): Payer: 59 | Admitting: Physician Assistant

## 2022-05-28 ENCOUNTER — Encounter: Payer: Self-pay | Admitting: Physician Assistant

## 2022-05-28 VITALS — BP 115/68 | HR 86 | Ht 59.0 in | Wt 103.0 lb

## 2022-05-28 DIAGNOSIS — H1013 Acute atopic conjunctivitis, bilateral: Secondary | ICD-10-CM | POA: Diagnosis not present

## 2022-05-28 DIAGNOSIS — R0982 Postnasal drip: Secondary | ICD-10-CM

## 2022-05-28 DIAGNOSIS — Z1231 Encounter for screening mammogram for malignant neoplasm of breast: Secondary | ICD-10-CM

## 2022-05-28 DIAGNOSIS — R051 Acute cough: Secondary | ICD-10-CM | POA: Diagnosis not present

## 2022-05-28 DIAGNOSIS — J309 Allergic rhinitis, unspecified: Secondary | ICD-10-CM

## 2022-05-28 MED ORDER — BENZONATATE 200 MG PO CAPS
200.0000 mg | ORAL_CAPSULE | Freq: Three times a day (TID) | ORAL | 0 refills | Status: DC | PRN
Start: 2022-05-28 — End: 2022-06-09

## 2022-05-28 MED ORDER — AZELASTINE HCL 0.05 % OP SOLN: 1.0000 [drp] | Freq: Two times a day (BID) | OPHTHALMIC | 0 refills | Status: AC

## 2022-05-28 MED ORDER — METHYLPREDNISOLONE 4 MG PO TBPK
ORAL_TABLET | ORAL | 0 refills | Status: DC
Start: 2022-05-28 — End: 2022-08-30

## 2022-05-28 NOTE — Patient Instructions (Signed)
Postnasal Drip Postnasal drip is the feeling of mucus going down the back of your throat. Mucus is a slimy substance that moistens and cleans your nose and throat, as well as the air pockets in face bones near your forehead and cheeks (sinuses). Small amounts of mucus pass from your nose and sinuses down the back of your throat all the time. This is normal. When you produce too much mucus or the mucus gets too thick, you can feel it. Some common causes of postnasal drip include: Having more mucus because of: A cold or the flu. Allergies. Cold air. Certain medicines. Gastroesophageal reflux. Having more mucus that is thicker because of: A sinus or nasal infection. Dry air. A food allergy. Follow these instructions at home: Relieving discomfort  Gargle with a mixture of salt and water 3-4 times a day or as needed. To make salt water, completely dissolve -1 tsp (3-6 g) of salt in 1 cup (237 mL) of warm water. If the air in your home is dry, use a humidifier to add moisture to the air. Use a saline spray or a container (neti pot) to flush out the nose (nasal irrigation). These methods can help clear away mucus and keep the nasal passages moist. General instructions Take over-the-counter and prescription medicines only as told by your health care provider. Follow instructions from your health care provider about eating or drinking restrictions. You may need to avoid caffeine. Avoid things that you know you are allergic to (allergens), like dust, mold, pollen, pets, or certain foods. Drink enough fluid to keep your urine pale yellow. Keep all follow-up visits. This is important. Contact a health care provider if: You have a fever. You have a sore throat or difficulty swallowing. You have a headache. You have sinus or ear pain. You have a cough that does not go away. The mucus from your nose becomes thick and is green or yellow in color. You have cold or flu symptoms that last more than 10  days. Summary Postnasal drip is the feeling of mucus going down the back of your throat. Use nasal irrigation or a nasal spray to help clear away mucus and keep the nasal passages moist. Avoid things that you know you are allergic to (allergens), like dust, mold, pollen, pets, or certain foods. This information is not intended to replace advice given to you by your health care provider. Make sure you discuss any questions you have with your health care provider. Document Revised: 11/30/2020 Document Reviewed: 11/30/2020 Elsevier Patient Education  2023 Else[employer] Inc. Allergic Conjunctivitis, Adult  Allergic conjunctivitis is inflammation of the clear membrane that covers the white part of your eye and the inner surface of your eyelid. This area is called the conjunctiva. This condition can make your eye red or pink. It can also make your eye feel itchy. This condition is not contagious. This means that it cannot be spread from one person to another person. What are the causes? This condition is caused by allergens. These are things that can cause an allergic reaction in some people. Common allergens include: Outdoor allergens, such as: Pollen, including pollen from grass and weeds. Mold. Car fumes. Indoor allergens, such as: Dust. Smoke. Mold. Proteins in a pet's pee (urine), saliva, or dander. Proteins that build up in contact lenses. What increases the risk? You are more likely to develop this condition if you have a family history of these things: Allergies. Conditions that you get because of allergens, such as asthma or  inflammation of the skin (eczema). What are the signs or symptoms? Symptoms of this condition include eyes that are: Itchy. Red. Watery. Puffy. Your eyes may also: Sting or burn. Have clear fluid draining from them. Have thick mucus coming from them. This happens in severe cases. How is this treated? Treatment for this condition may include: Using cold, wet  cloths (cold compresses) to soothe itching and swelling. Washing the face, hair, and clothing to remove allergens. Using eye drops. These may include: Eye drops that block allergies. Eye drops that reduce swelling and irritation. Steroid eye drops if other treatments have not worked. Oral antihistamine medicines. These medicines lessen your allergies. You may need these if eye drops do not help or are difficult to use. Air purifier at home and work. Wrap around sunglasses. This may help to block allergens from reaching the eye. Not wearing contact lenses, if the doctor has found that contact lenses caused your symptoms. Use daily wear disposal contact lenses instead. Follow these instructions at home: Eye care Place a cool, clean washcloth on your eye for 10-20 minutes. Do this 3-4 times a day. Do not touch or rub your eyes. Do not wear contact lenses until the inflammation is gone. Wear glasses instead. Do not wear eye makeup until the inflammation is gone. General instructions Try not to be around things that you are allergic to. Take or apply over-the-counter and prescription medicines only as told by your doctor. These include any eye drops. Drink enough fluid to keep your pee pale yellow. Keep all follow-up visits. Contact a doctor if: Your symptoms get worse. Your symptoms do not get better with treatment. You have mild eye pain. You are sensitive to light. You have spots or blisters on your eyes. You have pus coming from your eye. You have a fever. Get help right away if: You have redness, swelling, or other symptoms in only one eye. You cannot see well. You have other vision changes. You have very bad eye pain. Summary Allergic conjunctivitis is caused by allergens. It can make your eye red or pink, and it can make your eye feel itchy. This condition cannot be spread from one person to another person. Avoid things that you are allergic to. Take or apply over-the-counter  and prescription medicines only as told by your doctor. These include any eye drops. Contact your doctor if your symptoms get worse or they do not get better with treatment. This information is not intended to replace advice given to you by your health care provider. Make sure you discuss any questions you have with your health care provider. Document Revised: 03/09/2021 Document Reviewed: 03/09/2021 Elsevier Patient Education  2023 [employer].

## 2022-05-28 NOTE — Progress Notes (Signed)
Acute Office Visit  Subjective:     Patient ID: Haley Francis, female    DOB: [birth year], 67 y.o.   MRN: [id number]  Chief Complaint  Patient presents with   Cough    HPI Patient is in today for cough with her daughter to interpret. Pt has had cough for 4 days that has been dry. She denies any fever, chills, body aches, sinus pressure, ear pain, nausea, vomiting. She denies and SOB. She has had itchy left eye that waters some. Tried benadryl with no benefit.  .. Active Ambulatory Problems    Diagnosis Date Noted   Abnormal mammogram 02/28/2013   Polyp of colon, adenomatous 03/19/2013   Pre-diabetes 06/23/2013   Neoplasm of female genital organ 10/27/2014   Abnormal Papanicolaou smear of cervix with positive human papilloma virus (HPV) test 10/27/2014   Malignant neoplasm of exocervix (HCC) 03/25/2016   Cough 06/02/2017   Rectal bleeding 06/02/2017   Facet hypertrophy of lumbar region 10/12/2018   Bilateral shoulder pain 10/12/2019   Epigastric pain 02/09/2021   Myalgia 02/09/2021   Arthralgia 02/09/2021   Neuropathy 02/09/2021   History of chemotherapy 02/09/2021   Lumbar radiculopathy 02/09/2021   Osteopenia 02/28/2021   Bilateral leg pain 04/09/2022   Post-menopausal 04/09/2022   Hypertriglyceridemia 04/10/2022   Elevated LDL cholesterol level 04/10/2022   Resolved Ambulatory Problems    Diagnosis Date Noted   No Resolved Ambulatory Problems   No Additional Past Medical History    ROS  See HPI.     Objective:    BP 115/68   Pulse 86   Ht 4\' 11"  (1.499 m)   Wt 103 lb (46.7 kg)   SpO2 99%   BMI 20.80 kg/m  BP Readings from Last 3 Encounters:  05/28/22 115/68  04/09/22 136/74  02/07/21 114/62   Wt Readings from Last 3 Encounters:  05/28/22 103 lb (46.7 kg)  04/09/22 104 lb (47.2 kg)  02/07/21 103 lb (46.7 kg)      Physical Exam Constitutional:      Appearance: Normal appearance.  HENT:     Head: Normocephalic.     Right Ear: Tympanic  membrane, ear canal and external ear normal. There is no impacted cerumen.     Left Ear: Tympanic membrane, ear canal and external ear normal. There is no impacted cerumen.     Nose: Congestion present.     Comments: Swollen erythematous turbinates, bilateral.     Mouth/Throat:     Mouth: Mucous membranes are moist.     Pharynx: Posterior oropharyngeal erythema present.     Comments: PND.  Eyes:     General:        Right eye: Discharge present.        Left eye: Discharge present.    Conjunctiva/sclera: Conjunctivae normal.     Comments: Left more than right swollen erythematous lower eyelid with watery discharge and conjunctiva injected.   Cardiovascular:     Rate and Rhythm: Normal rate and regular rhythm.     Pulses: Normal pulses.     Heart sounds: Normal heart sounds.  Pulmonary:     Effort: Pulmonary effort is normal.     Breath sounds: Normal breath sounds.  Musculoskeletal:     Cervical back: Normal range of motion and neck supple. No tenderness.  Lymphadenopathy:     Cervical: No cervical adenopathy.  Neurological:     General: No focal deficit present.     Mental Status: She is alert.  Psychiatric:  Mood and Affect: Mood normal.          Assessment & Plan:  Marland KitchenMarland KitchenCherish was seen today for cough.  Diagnoses and all orders for this visit:  Allergic sinusitis -     methylPREDNISolone (MEDROL DOSEPAK) 4 MG TBPK tablet; Take as directed by package insert.  Visit for screening mammogram -     MM 3D SCREENING MAMMOGRAM BILATERAL BREAST; Future  Allergic conjunctivitis of both eyes -     methylPREDNISolone (MEDROL DOSEPAK) 4 MG TBPK tablet; Take as directed by package insert. -     azelastine (OPTIVAR) 0.05 % ophthalmic solution; Place 1 drop into both eyes 2 (two) times daily.  Post-nasal drip -     methylPREDNISolone (MEDROL DOSEPAK) 4 MG TBPK tablet; Take as directed by package insert.  Acute cough -     benzonatate (TESSALON) 200 MG capsule; Take 1 capsule  (200 mg total) by mouth 3 (three) times daily as needed.   No signs of bacterial infection  Medrol dose pack given with optivar eye drops for inflammation Cool compress for eyes Tessalon for cough Follow up as needed or if symptoms persist or worsen   Tandy Gaw, PA-C

## 2022-06-07 ENCOUNTER — Encounter: Payer: Self-pay | Admitting: Physician Assistant

## 2022-06-09 ENCOUNTER — Ambulatory Visit
Admission: RE | Admit: 2022-06-09 | Discharge: 2022-06-09 | Disposition: A | Discharge status: Home / Self Care | Payer: 59 | Source: Ambulatory Visit | Attending: Internal Medicine | Admitting: Internal Medicine

## 2022-06-09 VITALS — BP 102/69 | HR 79 | Temp 98.7°F | Resp 16 | Ht 59.0 in | Wt 103.0 lb

## 2022-06-09 DIAGNOSIS — R052 Subacute cough: Secondary | ICD-10-CM

## 2022-06-09 MED ORDER — BENZONATATE 200 MG PO CAPS: 200.0000 mg | ORAL_CAPSULE | Freq: Three times a day (TID) | ORAL | 0 refills | Status: DC | PRN

## 2022-06-09 MED ORDER — FLUTICASONE PROPIONATE 50 MCG/ACT NA SUSP: 1.0000 | Freq: Every day | NASAL | 0 refills | Status: AC

## 2022-06-09 NOTE — ED Triage Notes (Addendum)
Pt was seen by PCP on 05/28/22 for cough & congestion Pt was given medrol dose pak &  tessalon perles  Took medrol dose pak , not tessalon perles  Cough became worse on 06/05/22 Also worse at night  Pt is leaving for [place] tomorrow for 3 weeks Pt is here with daughter  Pt refused interpretor

## 2022-06-09 NOTE — ED Provider Notes (Signed)
[facility]    CSN: [id number] Arrival date & time: 06/09/22  0932      History   Chief Complaint Chief Complaint  Patient presents with   Cough    HPI Haley Francis is a 67 y.o. female comes to the urgent care with persistent nonproductive cough of at least 1 week duration.  Patient was seen by the primary care provider last week and was prescribed a tapering dose of steroids and Tessalon Perles.  Took the Medrol Dosepak with some improvement in his symptoms.  She did not take [employer].  After completion of the Medrol Dosepak the cough has worsened slightly.  She has some postnasal drainage.  Cough is worse at night.  No fever or chills.  No chest pain, chest tightness or chest pressure.  No nausea or vomiting.  No eye, throat or ear itching HPI  History reviewed. No pertinent past medical history.  Patient Active Problem List   Diagnosis Date Noted   Hypertriglyceridemia 04/10/2022   Elevated LDL cholesterol level 04/10/2022   Bilateral leg pain 04/09/2022   Post-menopausal 04/09/2022   Osteopenia 02/28/2021   Epigastric pain 02/09/2021   Myalgia 02/09/2021   Arthralgia 02/09/2021   Neuropathy 02/09/2021   History of chemotherapy 02/09/2021   Lumbar radiculopathy 02/09/2021   Bilateral shoulder pain 10/12/2019   Facet hypertrophy of lumbar region 10/12/2018   Cough 06/02/2017   Rectal bleeding 06/02/2017   Malignant neoplasm of exocervix (HCC) 03/25/2016   Neoplasm of female genital organ 10/27/2014   Abnormal Papanicolaou smear of cervix with positive human papilloma virus (HPV) test 10/27/2014   Pre-diabetes 06/23/2013   Polyp of colon, adenomatous 03/19/2013   Abnormal mammogram 02/28/2013    History reviewed. No pertinent surgical history.  OB History   No obstetric history on file.      Home Medications    Prior to Admission medications   Medication Sig Start Date End Date Taking? Authorizing Provider  benzonatate (TESSALON) 200 MG  capsule Take 1 capsule (200 mg total) by mouth 3 (three) times daily as needed for cough. 06/09/22  Yes Taegen Lennox, Britta Mccreedy, MD  acetaminophen (TYLENOL) 500 MG tablet Take 500 mg by mouth as needed.    [provider]  atorvastatin (LIPITOR) 40 MG tablet Take 1 tablet (40 mg total) by mouth daily. 04/23/22   Breeback, Jade L, PA-C  azelastine (OPTIVAR) 0.05 % ophthalmic solution Place 1 drop into both eyes 2 (two) times daily. 05/28/22   Breeback, Lesly Rubenstein L, PA-C  calcium carbonate (TUMS - DOSED IN MG ELEMENTAL CALCIUM) 500 MG chewable tablet Chew 1 tablet by mouth as needed for indigestion or heartburn.    [provider]  L-Methylfolate-B6-B12 Hebert Soho) 3-35-2 MG TABS Take 1 tablet by mouth twice daily 05/24/22   Breeback, Jade L, PA-C  methylPREDNISolone (MEDROL DOSEPAK) 4 MG TBPK tablet Take as directed by package insert. Patient not taking: Reported on 06/09/2022 05/28/22   Jomarie Longs, PA-C  omeprazole (PRILOSEC) 40 MG capsule Take 1 capsule (40 mg total) by mouth daily. 04/26/22   Jomarie Longs, PA-C    Family History Family History  Problem Relation Age of Onset   Hypertension Mother    Diabetes Mother    Hyperlipidemia Mother     Social History Social History   Tobacco Use   Smoking status: Never   Smokeless tobacco: Never  Vaping Use   Vaping Use: Never used  Substance Use Topics   Alcohol use: No  Alcohol/week: 1.0 standard drink of alcohol    Types: 1 Cans of beer per week   Drug use: No     Allergies   Citrus   Review of Systems Review of Systems As per HPI  Physical Exam Triage Vital Signs ED Triage Vitals  Enc Vitals Group     BP 06/09/22 0944 102/69     Pulse Rate 06/09/22 0944 79     Resp 06/09/22 0944 16     Temp 06/09/22 0944 98.7 F (37.1 C)     Temp Source 06/09/22 0944 Oral     SpO2 06/09/22 0944 98 %     Weight 06/09/22 0946 103 lb (46.7 kg)     Height 06/09/22 0946 4\' 11"  (1.499 m)     Head Circumference --      Peak Flow  --      Pain Score 06/09/22 0946 0     Pain Loc --      Pain Edu? --      Excl. in GC? --    No data found.  Updated Vital Signs BP 102/69 (BP Location: Left Arm)   Pulse 79   Temp 98.7 F (37.1 C) (Oral)   Resp 16   Ht 4\' 11"  (1.499 m)   Wt 46.7 kg   SpO2 98%   BMI 20.80 kg/m   Visual Acuity Right Eye Distance:   Left Eye Distance:   Bilateral Distance:    Right Eye Near:   Left Eye Near:    Bilateral Near:     Physical Exam Vitals and nursing note reviewed.  Constitutional:      General: She is not in acute distress.    Appearance: Normal appearance. She is not ill-appearing.  HENT:     Right Ear: Tympanic membrane normal.     Left Ear: Tympanic membrane normal.     Mouth/Throat:     Mouth: Mucous membranes are moist.     Pharynx: No oropharyngeal exudate or posterior oropharyngeal erythema.  Eyes:     Extraocular Movements: Extraocular movements intact.     Pupils: Pupils are equal, round, and reactive to light.  Cardiovascular:     Rate and Rhythm: Normal rate and regular rhythm.  Pulmonary:     Effort: Pulmonary effort is normal.     Breath sounds: Normal breath sounds.  Abdominal:     General: Bowel sounds are normal.     Palpations: Abdomen is soft.  Neurological:     Mental Status: She is alert.      UC Treatments / Results  Labs (all labs ordered are listed, but only abnormal results are displayed) Labs Reviewed - No data to display  EKG   Radiology No results found.  Procedures Procedures (including critical care time)  Medications Ordered in UC Medications - No data to display  Initial Impression / Assessment and Plan / UC Course  I have reviewed the triage vital signs and the nursing notes.  Pertinent labs & imaging results that were available during my care of the patient were reviewed by me and considered in my medical decision making (see chart for details).     1.  Subacute cough: Tessalon Perles as needed for  cough Patient is advised to maintain adequate hydration No need for steroid use at this time giving recent Medrol Dosepak use. Lung exam is reassuring Return precautions given. Final Clinical Impressions(s) / UC Diagnoses   Final diagnoses:  Subacute cough     Discharge  Instructions      Maintain adequate hydration Take medications as prescribed If you have worsening symptoms please return to urgent care to be reevaluated The cough is usually the last symptom to resolve after viral respiratory infection.  Your lung exam is reassuring.   ED Prescriptions     Medication Sig Dispense Auth. Provider   benzonatate (TESSALON) 200 MG capsule Take 1 capsule (200 mg total) by mouth 3 (three) times daily as needed for cough. 30 capsule Elisheba Mcdonnell, Britta Mccreedy, MD      PDMP not reviewed this encounter.   Merrilee Jansky, MD 06/09/22 1037

## 2022-06-09 NOTE — Discharge Instructions (Addendum)
Maintain adequate hydration Take medications as prescribed If you have worsening symptoms please return to urgent care to be reevaluated The cough is usually the last symptom to resolve after viral respiratory infection.  Your lung exam is reassuring.

## 2022-06-11 NOTE — Telephone Encounter (Signed)
Needs appt

## 2022-06-11 NOTE — Telephone Encounter (Signed)
'  s appointment.  If she was better for 3 weeks and this is unlikely to be a continuation of the original illness.  It sounds like it is most likely something new.  I am not sure if she is leaving as in yesterday or next Monday?

## 2022-08-29 ENCOUNTER — Encounter: Payer: Self-pay | Admitting: Physician Assistant

## 2022-08-29 ENCOUNTER — Other Ambulatory Visit: Payer: Self-pay | Admitting: Physician Assistant

## 2022-08-29 DIAGNOSIS — R1013 Epigastric pain: Secondary | ICD-10-CM

## 2022-08-29 MED ORDER — OMEPRAZOLE 40 MG PO CPDR
40.0000 mg | DELAYED_RELEASE_CAPSULE | Freq: Every day | ORAL | 3 refills | Status: AC
Start: 2022-08-29 — End: ?

## 2022-08-30 MED ORDER — BENZONATATE 200 MG PO CAPS: 200.0000 mg | ORAL_CAPSULE | Freq: Three times a day (TID) | ORAL | 0 refills | Status: DC | PRN

## 2022-09-13 ENCOUNTER — Other Ambulatory Visit: Payer: Self-pay | Admitting: Physician Assistant

## 2022-09-13 MED ORDER — CALCIUM CARBONATE ANTACID 500 MG PO CHEW: 1.0000 | CHEWABLE_TABLET | ORAL | 3 refills | Status: AC | PRN

## 2022-09-13 NOTE — Telephone Encounter (Signed)
Requesting rx rf of TUMS Last written by historical provider Last OV 05/28/2022 No upcoming appt schld.

## 2022-11-07 ENCOUNTER — Other Ambulatory Visit: Payer: Self-pay | Admitting: Physician Assistant

## 2023-02-10 ENCOUNTER — Ambulatory Visit (INDEPENDENT_AMBULATORY_CARE_PROVIDER_SITE_OTHER): Payer: 59 | Admitting: Physician Assistant

## 2023-02-10 ENCOUNTER — Encounter: Payer: Self-pay | Admitting: Physician Assistant

## 2023-02-10 VITALS — BP 111/67 | HR 65 | Ht 59.0 in | Wt 104.8 lb

## 2023-02-10 DIAGNOSIS — M62838 Other muscle spasm: Secondary | ICD-10-CM | POA: Insufficient documentation

## 2023-02-10 DIAGNOSIS — E7800 Pure hypercholesterolemia, unspecified: Secondary | ICD-10-CM | POA: Diagnosis not present

## 2023-02-10 DIAGNOSIS — M6281 Muscle weakness (generalized): Secondary | ICD-10-CM | POA: Diagnosis not present

## 2023-02-10 DIAGNOSIS — D126 Benign neoplasm of colon, unspecified: Secondary | ICD-10-CM

## 2023-02-10 DIAGNOSIS — E781 Pure hyperglyceridemia: Secondary | ICD-10-CM

## 2023-02-10 DIAGNOSIS — Z780 Asymptomatic menopausal state: Secondary | ICD-10-CM | POA: Diagnosis not present

## 2023-02-10 DIAGNOSIS — R7303 Prediabetes: Secondary | ICD-10-CM

## 2023-02-10 DIAGNOSIS — Z13820 Encounter for screening for osteoporosis: Secondary | ICD-10-CM | POA: Diagnosis not present

## 2023-02-10 DIAGNOSIS — G629 Polyneuropathy, unspecified: Secondary | ICD-10-CM | POA: Diagnosis not present

## 2023-02-10 DIAGNOSIS — R202 Paresthesia of skin: Secondary | ICD-10-CM

## 2023-02-10 DIAGNOSIS — Z1231 Encounter for screening mammogram for malignant neoplasm of breast: Secondary | ICD-10-CM | POA: Diagnosis not present

## 2023-02-10 DIAGNOSIS — Z0000 Encounter for general adult medical examination without abnormal findings: Secondary | ICD-10-CM | POA: Diagnosis not present

## 2023-02-10 NOTE — Progress Notes (Signed)
Complete physical exam  Patient: Haley Francis   DOB: Oct 14, 1955   68 y.o. Female  MRN: [id number]  Subjective:    Chief Complaint  Patient presents with   Annual Exam    Patient in office with other - here for annual exam     Haley Francis is a 68 y.o. female who presents today for a complete physical exam. She reports consuming a general diet.  Pt walks everyday and lives a very active life.   She generally feels well. She reports sleeping well. She does have additional problems to discuss today. She has continued intermittent numbness and tingling of right foot. Seems to be worse at night. PT did help and would like to have an order to do again. No trouble walking. She does feel "weaker" in her legs on the right side. Denies any radiation of pain or numbness up leg. Denies any low back pain, bowel or bladder dysfunction.    Most recent fall risk assessment:    02/10/2023    9:08 AM  Fall Risk   Falls in the past year? 0  Number falls in past yr: 0  Injury with Fall? 0  Risk for fall due to : No Fall Risks  Follow up Falls evaluation completed     Most recent depression screenings:    02/10/2023   11:06 AM 04/09/2022    9:38 AM  PHQ 2/9 Scores  PHQ - 2 Score 0 0    Vision:Within last year and Dental: No current dental problems, Receives regular dental care, and No regular dental care   Patient Active Problem List   Diagnosis Date Noted   Muscle spasm 02/10/2023   Muscle weakness (generalized) 02/10/2023   Hypertriglyceridemia 04/10/2022   Elevated LDL cholesterol level 04/10/2022   Bilateral leg pain 04/09/2022   Post-menopausal 04/09/2022   Osteopenia 02/28/2021   Epigastric pain 02/09/2021   Myalgia 02/09/2021   Arthralgia 02/09/2021   Neuropathy 02/09/2021   History of chemotherapy 02/09/2021   Lumbar radiculopathy 02/09/2021   Bilateral shoulder pain 10/12/2019   Facet hypertrophy of lumbar region 10/12/2018   Cough 06/02/2017   Rectal bleeding 06/02/2017    Malignant neoplasm of exocervix (HCC) 03/25/2016   Neoplasm of female genital organ 10/27/2014   Abnormal Papanicolaou smear of cervix with positive human papilloma virus (HPV) test 10/27/2014   Pre-diabetes 06/23/2013   Polyp of colon, adenomatous 03/19/2013   Abnormal mammogram 02/28/2013   History reviewed. No pertinent past medical history. History reviewed. No pertinent surgical history. Social History   Tobacco Use   Smoking status: Never   Smokeless tobacco: Never  Vaping Use   Vaping status: Never Used  Substance Use Topics   Alcohol use: No    Alcohol/week: 1.0 standard drink of alcohol    Types: 1 Cans of beer per week   Drug use: No   Family History  Problem Relation Age of Onset   Hypertension Mother    Diabetes Mother    Hyperlipidemia Mother    Allergies  Allergen Reactions   Citrus Swelling      Patient Care Team: Nolene Ebbs as PCP - General (Family Medicine)   Outpatient Medications Prior to Visit  Medication Sig   acetaminophen (TYLENOL) 500 MG tablet Take 500 mg by mouth as needed.   atorvastatin (LIPITOR) 40 MG tablet Take 1 tablet (40 mg total) by mouth daily.   azelastine (OPTIVAR) 0.05 % ophthalmic solution Place 1 drop into both eyes 2 (two)  times daily.   calcium carbonate (TUMS - DOSED IN MG ELEMENTAL CALCIUM) 500 MG chewable tablet Chew 1 tablet (200 mg of elemental calcium total) by mouth as needed for indigestion or heartburn.   fluticasone (FLONASE) 50 MCG/ACT nasal spray Place 1 spray into both nostrils daily.   L-Methylfolate-B6-B12 (FOLTANX) 3-35-2 MG TABS Take 1 tablet by mouth twice daily   omeprazole (PRILOSEC) 40 MG capsule Take 1 capsule (40 mg total) by mouth daily.   [DISCONTINUED] benzonatate (TESSALON) 200 MG capsule Take 1 capsule (200 mg total) by mouth 3 (three) times daily as needed for cough.   No facility-administered medications prior to visit.    Review of Systems  All other systems reviewed and are  negative.         Objective:     BP 111/67 (BP Location: Left Arm, Patient Position: Sitting, Cuff Size: Normal)   Pulse 65   Ht 4\' 11"  (1.499 m)   Wt 104 lb 12 oz (47.5 kg)   SpO2 99%   BMI 21.16 kg/m  BP Readings from Last 3 Encounters:  02/10/23 111/67  06/09/22 102/69  05/28/22 115/68   Wt Readings from Last 3 Encounters:  02/10/23 104 lb 12 oz (47.5 kg)  06/09/22 103 lb (46.7 kg)  05/28/22 103 lb (46.7 kg)      Physical Exam  BP 111/67 (BP Location: Left Arm, Patient Position: Sitting, Cuff Size: Normal)   Pulse 65   Ht 4\' 11"  (1.499 m)   Wt 104 lb 12 oz (47.5 kg)   SpO2 99%   BMI 21.16 kg/m   General Appearance:    Alert, cooperative, no distress, appears stated age  Head:    Normocephalic, without obvious abnormality, atraumatic  Eyes:    PERRL, conjunctiva/corneas clear, EOM's intact, fundi    benign, both eyes  Ears:    Normal TM's and external ear canals, both ears  Nose:   Nares normal, septum midline, mucosa normal, no drainage    or sinus tenderness  Throat:   Lips, mucosa, and tongue normal; teeth and gums normal  Neck:   Supple, symmetrical, trachea midline, no adenopathy;    thyroid:  no enlargement/tenderness/nodules; no carotid   bruit or JVD  Back:     Symmetric, no curvature, ROM normal, no CVA tenderness  Lungs:     Clear to auscultation bilaterally, respirations unlabored  Chest Wall:    No tenderness or deformity   Heart:    Regular rate and rhythm, S1 and S2 normal, no murmur, rub   or gallop     Abdomen:     Soft, non-tender, bowel sounds active all four quadrants,    no masses, no organomegaly        Extremities:   Extremities normal, atraumatic, no cyanosis or edema  Pulses:   2+ and symmetric all extremities  Skin:   Skin color, texture, turgor normal, no rashes or lesions  Lymph nodes:   Cervical, supraclavicular, and axillary nodes normal  Neurologic:   CNII-XII intact, normal strength, sensation and reflexes    throughout        Assessment & Plan:    Routine Health Maintenance and Physical Exam  Immunization History  Administered Date(s) Administered   Influenza Split 10/15/2010   Janssen (J&J) SARS-COV-2 Vaccination 03/28/2019, 12/01/2019   PNEUMOCOCCAL CONJUGATE-20 04/09/2022   Tdap 02/17/2013    Health Maintenance  Topic Date Due   Medicare Annual Wellness (AWV)  08/14/2021   MAMMOGRAM  02/28/2022  Colonoscopy  09/11/2022   COVID-19 Vaccine (3 - 2024-25 season) 02/26/2023 (Originally 09/15/2022)   INFLUENZA VACCINE  04/14/2023 (Originally 08/15/2022)   Zoster Vaccines- Shingrix (1 of 2) 05/11/2023 (Originally 06/08/1973)   DTaP/Tdap/Td (2 - Td or Tdap) 02/18/2023   Pneumonia Vaccine 49+ Years old  Completed   DEXA SCAN  Completed   Hepatitis C Screening  Completed   HPV VACCINES  Aged Out    Discussed health benefits of physical activity, and encouraged her to engage in regular exercise appropriate for her age and condition.  Marland KitchenJoelyn Oms was seen today for annual exam.  Diagnoses and all orders for this visit:  Routine physical examination -     CBC w/Diff/Platelet -     CMP14+EGFR -     Lipid panel -     TSH -     Vitamin D (25 hydroxy) -     B12 and Folate Panel  Post-menopausal -     Vitamin D (25 hydroxy) -     B12 and Folate Panel  Hypertriglyceridemia -     Lipid panel  Elevated LDL cholesterol level -     Lipid panel  Pre-diabetes -     CMP14+EGFR  Paresthesia of foot -     CBC w/Diff/Platelet -     CMP14+EGFR -     Vitamin D (25 hydroxy) -     B12 and Folate Panel -     NCV with EMG(electromyography); Future -     Ambulatory referral to Physical Therapy  Visit for screening mammogram -     MM 3D SCREENING MAMMOGRAM BILATERAL BREAST  Osteoporosis screening -     DG Bone Density; Future  Adenomatous polyp of colon, unspecified part of colon -     Ambulatory referral to Gastroenterology  Muscle weakness (generalized) -     NCV with EMG(electromyography);  Future -     Ambulatory referral to Physical Therapy  Muscle spasm -     NCV with EMG(electromyography); Future -     Ambulatory referral to Physical Therapy   .Marland Kitchen Discussed 150 minutes of exercise a week.  Encouraged vitamin D 1000 units and Calcium 1300mg  or 4 servings of dairy a day.  Fasting labs ordered today Vitals look great PHQ no concerns Needs repeat colonoscopy due to hx of polyps, ordered today Mammogram and bone density ordered today Pap not indicated Will order EMG testing for right foot neuropathy Consider gabapentin for numbness and tingling Request another referral to PT Declined all vaccines   Return in about 6 months (around 08/10/2023).     Tandy Gaw, PA-C

## 2023-02-10 NOTE — Patient Instructions (Addendum)
Will need colonoscopy. GI suggested 5 year follow up.  Will order mammogram and bone density  Make sure taking at least 1000 units of vitamin D a day with 1300mg  of calcium.   Will get EMG testing.  Consider gabapentin at bedtime for numbness and tingling  Thng tin s?c kh?e x??ng cho ng??i cao tu?i Bone Health Information for Aging Adults X??ng c?a qu v? lm nhi?u vi?c h?n l ch? nng ?? c? th? qu v?. Cc x??ng ny c?ng l?u tr? canxi. Bn trong x??ng c?a qu v? (t?y) s?n sinh ra cc t? bo mu. Duy tr s?c kh?e c?a x??ng tr? nn quan tr?ng h?n khi qu v? gi ?i b?i v x??ng c?a qu v? thay th? t?t c? cc t? bo c?a x??ng kho?ng 10 n?m m?t l?n. Kho?ng 64 tu?i, vi?c thay th? cc t? bo ny tr? nn kh h?n v x??ng c th? tr? nn y?u h?n. X??ng y?u c th? d?n ??n long x??ng v v? (gy x??ng). T ng c?ng d? x?y ra h?n, ?i?u ? c th? gy gy x??ng. Tin t?t lnh l, v?i ch? ?? ?n u?ng v t?p th? d?c, qu v? c th? c?i thi?n v duy tr s?c kh?e c?a x??ng qu v? ? b?t k? ?? tu?i no. ?n th? no cho s?c kh?e c?a x??ng Ch? ?? ?n cn b?ng c th? cung c?p nhi?u lo?i vitamin, khong ch?t v protein m qu v? c?n cho s?c kh?e c?a x??ng. Ng??i cao tu?i c?n ??m b?o h? nh?n ?? canxi, vitamin D v magi. Qu v? c th? c?n nh?ng ch?t ny nhi?u h?n khi qu v? gi ?i. Canxi  Canxi l khong ch?t quan tr?ng nh?t (thi?t y?u) cho s?c kh?e c?a x??ng. Nhu c?u canxi h?ng ngy ??i v?i nam gi?i tr??ng thnh t? 60-70 tu?i l 1.000 mg. ??i v?i ph? n? tr??ng thnh t? 60-70 tu?i, nhu c?u ny l 1.200 mg. T? 71 tu?i tr? ln, nhu c?u ny l 1.200 mg cho c? nam gi?i v n? gi?i. Cc ngu?n canxi trong ch? ?? ?n bao g?m: Th?c ph?m t? s?a nh? s?a, s?a chua v pho mt. Th?c ph?m t? s?a l ngu?n t?t nh?t. N?u qu v? khng th? ?n s?a, qu v? c th? c?n th?c ph?m b? sung canxi. Rau l mu xanh ??m. Cc lo?i rau ny bao g?m c?i r?, c?i xo?n, bng c?i xanh, c?i tha v ??u b?p. C bo, nh? c mi v c h?i ?ng h?p c x??ng. H?nh  nhn. ??u ph?.  Vitamin D Qu v? c?n vitamin D cho s?c kh?e c?a x??ng v vitamin ny gip c? th? h?p th? canxi t? ch? ?? ?n. Vitamin ny khng xu?t hi?n t? nhin trong nhi?u lo?i th?c ph?m. Nhi?u ng??i c th? ???c h??ng l?i t? vi?c dng th?c ph?m b? sung. Nhu c?u h?ng ngy ??i v?i vitamin D t? 60 tu?i tr? ln l 600-1.000 ??n v? qu?c t? (IU). Cc ngu?n vitamin D trong ch? ?? ?n bao g?m: C bo, ch?ng h?n nh? c ki?m, c h?i, c mi v c thu. Cc th?c ph?m c b? sung vitamin D (???c t?ng c??ng), nh? ng? c?c v cc s?n ph?m t? s?a. Lng ?? tr?ng. Magi Magi gip c? th? qu v? s? d?ng c? canxi v vitamin D. L??ng tiu th? h?ng ngy ???c khuy?n ngh? cho nam gi?i tr??ng thnh l 400-420 mg. ??i v?i ph? n? tr??ng thnh, m?c ny l 310-320 mg. Cc ngu?n magi trong ch? ?? ?n bao  g?m: Ferne Coe, nh? l c?i r?, c?i xo?n, c?i tha v ??u b?p. H?t anh th?o, h?t m v h?t chia. Qu? ??u, bao g?m ??u H Lan v cc lo?i ??u h?t. Ng? c?c nguyn cm. Qu? b?. Qu? h?ch. N?u qu v? u?ng r??u th??ng xuyn ho?c dng m?t lo?i thu?c khng axit ???c g?i l thu?c ?c ch? b?m proton, qu v? c th? ???c h??ng l?i t? th?c ph?m b? sung c magi. Cch t?p luy?n cho s?c kh?e c?a x??ng T?p th? d?c c vai tr quan tr?ng ??i v?i s?c kh?e c?a x??ng nh? t?ng c??ng s?c m?nh c?a x??ng v c?a c?. X??ng l cc c? quan s?ng tr? nn m?nh m? h?n khi qu v? t?p th? d?c, gi?ng nh? tim v cc c? khc. C? h? tr? cho x??ng v b?o v? kh?p. C? m?nh m? c?ng gip ng?n ng?a m?t x??ng v t ng. Cc bi t?p ch?u s?c n?ng v cc bi t?p s?c b?n l hai lo?i bi t?p quan tr?ng nh?t cho s?c kh?e c?a x??ng. Cc bi t?p ch?u s?c n?ng bao g?m ch?y ho?c ?i b?, leo c?u thang v ch?i th? thao nh? tennis. Cc bi t?p s?c b?n bao g?m cc bi t?p s? d?ng t? t? do, my nng t?, ho?c dy khng l?c. C? g?ng t?p th? d?c t nh?t 30 pht m?i ngy. Qu v? c?ng c th? t?p cc bi t?p gin c?, th?ng b?ng v linh ho?t, nh? yoga ho?c thi c?c quy?n. Nh?ng bi t?p ny lm  gi?m nguy c? t ng c?a qu v?. Tun th? nh?ng h??ng d?n sau ?y khi ? nh Hy h?i chuyn gia ch?m [place] s?c kh?e xem: Qu v? c c?n ki?m tra m?t ?? x??ng hay khng. ?i?u ny ??c bi?t quan tr?ng ??i v?i ph? n? trn 65 tu?i v nam gi?i trn 70 tu?i. Qu v? ? m?t chi?u cao. M?t chi?u cao c th? l d?u hi?u c?a y?u x??ng ? c?t s?ng. B?t k? lo?i thu?c ho?c tnh tr?ng b?nh l no c?ng c th? ?nh h??ng ??n s?c kh?e c?a x??ng qu v?. Bc s? ? c th? khuy?n ngh? m?t ch??ng trnh t?p th? d?c an ton cho qu v?. ??m b?o bao g?m c? cc bi t?p ch?u s?c n?ng v bi t?p s?c b?n. Khng s? d?ng b?t k? s?n ph?m no c nicotine ho?c thu?c l. Nh?ng s?n ph?m ny bao g?m thu?c l d?ng ht, thu?c l d?ng nhai v d?ng c? ht thu?c, ch?ng h?n nh? thu?c l ?i?n t?. Nh?ng s?n ph?m ny c th? lm gi?m m?t ?? x??ng. N?u qu v? c?n gip ?? ?? cai thu?c, hy h?i chuyn gia ch?m [place] s?c kh?e. U?ng r??u m?t cch ?i?u ??. R??u lm gi?m m?t ?? x??ng v t?ng nguy c? b? t ng. N?u qu v? u?ng r??u: Gi?i h?n l??ng r??u qu v? u?ng ? m?c: 0-1 ly/ngy ??i v?i ph? n? khng mang thai. 0-2 ly/ngy ??i v?i nam gi?i. Bi?t m?t ly c bao nhiu r??u. ? M?, m?t ly t??ng ???ng v?i m?t chai bia 12 ao-x? (355 mL), m?t ly r??u vang 5 ao-x? (148 mL), ho?c m?t ly r??u m?nh 1 ao-x? (44 mL). Ch? s? d?ng thu?c khng k ??n v thu?c k ??n theo ch? d?n c?a chuyn gia ch?m [place] s?c kh?e. Hy h?i chuyn gia ch?m [place] s?c kh?e xem qu v? c th? ???c h??ng l?i t? vi?c dng canxi, vitamin D ho?c th?c ph?m b? sung magi hay khng. Tun  th? theo t?t c? cc l?n khm l?i. ?i?u ny c vai tr quan tr?ng. N?i tm thm thng tin [employer] (S?c kh?e X??ng [place] K?): http://www.craig-choi.com/ American Academy of Orthopaedic Surgeons (H?c vi?n Ph?u thu?t Ch?nh hnh [place]?): orthoinfo.org [employer] (Vi?n Y t? Qu?c gia): bones.http://www.myers.net/ Tm t?t Vi?c duy tr s?c kh?e x??ng tr? nn quan tr?ng h?n khi qu v? gi ?i. Qu v? c th? c?i  thi?n v duy tr s?c kh?e c?a x??ng qu v? b?ng ch? ?? ?n v t?p th? d?c ? b?t k? ?? tu?i no. Ch? ?? ?n cn b?ng c th? cung c?p nhi?u lo?i vitamin, khong ch?t v protein m qu v? c?n cho s?c kh?e c?a x??ng. Ng??i cao tu?i c?n ??m b?o h? nh?n ?? canxi, vitamin D v magi. Hy h?i chuyn gia ch?m [place] s?c kh?e xem qu v? c th? ???c h??ng l?i t? vi?c dng canxi, vitamin D ho?c th?c ph?m b? sung magi hay khng. T?p th? d?c c vai tr quan tr?ng ??i v?i s?c kh?e c?a x??ng nh? t?ng c??ng s?c m?nh c?a x??ng v c?a c?. T?p c? cc bi t?p ch?u s?c n?ng v cc bi t?p s?c b?n. Thng tin ny khng nh?m m?c ?ch thay th? cho l?i khuyn m chuyn gia ch?m [place] s?c kh?e ni v?i qu v?. Hy b?o ??m qu v? ph?i th?o lu?n b?t k? v?n ?? g m qu v? c v?i chuyn gia ch?m [place] s?c kh?e c?a qu v?. Document Revised: 07/21/2020 Document Reviewed: 07/21/2020 Elsevier Patient Education  2024 [employer].

## 2023-02-11 ENCOUNTER — Other Ambulatory Visit: Payer: Self-pay | Admitting: Physician Assistant

## 2023-02-11 ENCOUNTER — Encounter: Payer: Self-pay | Admitting: Physician Assistant

## 2023-02-11 LAB — CMP14+EGFR
ALT: 21 [IU]/L (ref 0–32)
AST: 25 [IU]/L (ref 0–40)
Albumin: 4.2 g/dL (ref 3.9–4.9)
Alkaline Phosphatase: 78 [IU]/L (ref 44–121)
BUN/Creatinine Ratio: 24 (ref 12–28)
BUN: 18 mg/dL (ref 8–27)
Bilirubin Total: 0.3 mg/dL (ref 0.0–1.2)
CO2: 20 mmol/L (ref 20–29)
Calcium: 9.2 mg/dL (ref 8.7–10.3)
Chloride: 106 mmol/L (ref 96–106)
Creatinine, Ser: 0.75 mg/dL (ref 0.57–1.00)
Globulin, Total: 2.9 g/dL (ref 1.5–4.5)
Glucose: 96 mg/dL (ref 70–99)
Potassium: 4.4 mmol/L (ref 3.5–5.2)
Sodium: 139 mmol/L (ref 134–144)
Total Protein: 7.1 g/dL (ref 6.0–8.5)
eGFR: 87 mL/min/{1.73_m2} (ref >59)

## 2023-02-11 LAB — CBC WITH DIFFERENTIAL/PLATELET
Basophils Absolute: 0 10*3/uL (ref 0.0–0.2)
Basos: 0 %
EOS (ABSOLUTE): 0.2 10*3/uL (ref 0.0–0.4)
Eos: 4 %
Hematocrit: 37.8 % (ref 34.0–46.6)
Hemoglobin: 12.2 g/dL (ref 11.1–15.9)
Immature Grans (Abs): 0 10*3/uL (ref 0.0–0.1)
Immature Granulocytes: 0 %
Lymphocytes Absolute: 1.3 10*3/uL (ref 0.7–3.1)
Lymphs: 22 %
MCH: 29.3 pg (ref 26.6–33.0)
MCHC: 32.3 g/dL (ref 31.5–35.7)
MCV: 91 fL (ref 79–97)
Monocytes Absolute: 0.6 10*3/uL (ref 0.1–0.9)
Monocytes: 10 %
Neutrophils Absolute: 3.9 10*3/uL (ref 1.4–7.0)
Neutrophils: 64 %
Platelets: 277 10*3/uL (ref 150–450)
RBC: 4.16 x10E6/uL (ref 3.77–5.28)
RDW: 12.7 % (ref 11.7–15.4)
WBC: 6 10*3/uL (ref 3.4–10.8)

## 2023-02-11 LAB — LIPID PANEL
Chol/HDL Ratio: 4.3 {ratio} (ref 0.0–4.4)
Cholesterol, Total: 214 mg/dL — ABNORMAL HIGH (ref 100–199)
HDL: 50 mg/dL (ref >39)
LDL Chol Calc (NIH): 126 mg/dL — ABNORMAL HIGH (ref 0–99)
Triglycerides: 216 mg/dL — ABNORMAL HIGH (ref 0–149)
VLDL Cholesterol Cal: 38 mg/dL (ref 5–40)

## 2023-02-11 LAB — TSH: TSH: 3.5 u[IU]/mL (ref 0.450–4.500)

## 2023-02-11 LAB — B12 AND FOLATE PANEL
Folate: >20 ng/mL (ref >3.0)
Vitamin B-12: 1070 pg/mL (ref 232–1245)

## 2023-02-11 LAB — VITAMIN D 25 HYDROXY (VIT D DEFICIENCY, FRACTURES): Vit D, 25-Hydroxy: 35.8 ng/mL (ref 30.0–100.0)

## 2023-02-11 MED ORDER — ATORVASTATIN CALCIUM 40 MG PO TABS: 40.0000 mg | ORAL_TABLET | Freq: Every day | ORAL | 3 refills | Status: AC

## 2023-02-11 NOTE — Progress Notes (Signed)
Your vitamin d is in normal range but low normal. Make sure taking at least 1000units of vitamin D3 a day.  B12 looks great.  Thyroid normal.  Kidney, liver, glucose look good.  Hemoglobin normal.   HDL, good cholesterol, looks great.  LDL, bad cholesterol, is not to goal.   10 year cardiovascular risk is 6.2 percent. Continue lipitor. Sent refill.   Marland Kitchen.The 10-year ASCVD risk score (Arnett DK, et al., 2019) is: 6.2%   Values used to calculate the score:     Age: 68 years     Sex: Female     Is Non-Hispanic African American: No     Diabetic: No     Tobacco smoker: No     Systolic Blood Pressure: 111 mmHg     Is BP treated: No     HDL Cholesterol: 50 mg/dL     Total Cholesterol: 214 mg/dL

## 2023-02-26 ENCOUNTER — Telehealth: Payer: Self-pay

## 2023-02-26 ENCOUNTER — Ambulatory Visit: Payer: 59

## 2023-02-26 VITALS — Ht 59.0 in | Wt 104.0 lb

## 2023-02-26 DIAGNOSIS — Z0000 Encounter for general adult medical examination without abnormal findings: Secondary | ICD-10-CM

## 2023-02-26 NOTE — Telephone Encounter (Signed)
A referral was placed for a follow up colonoscopy. It was for [facility] in [place]. It was sent to [facility]. It needs to be sent to [facility].

## 2023-02-26 NOTE — Progress Notes (Signed)
Subjective:   Haley Francis is a 68 y.o. female who presents for Medicare Annual (Subsequent) preventive examination.  Visit Complete: Virtual I connected with  Sendy Pluta on 02/26/23 by a audio enabled telemedicine application and verified that I am speaking with the correct person using two identifiers.  Patient Location: Other:  She is with her daughter at her daughter's home.   Provider Location: Office/Clinic  I discussed the limitations of evaluation and management by telemedicine. The patient expressed understanding and agreed to proceed.  Vital Signs: Because this visit was a virtual/telehealth visit, some criteria may be missing or patient reported. Any vitals not documented were not able to be obtained and vitals that have been documented are patient reported.  Patient Medicare AWV questionnaire was completed by the patient on n/a; I have confirmed that all information answered by patient is correct and no changes since this date.  Cardiac Risk Factors include: advanced age (>29men, >4 women);dyslipidemia     Objective:    Today's Vitals   02/26/23 0802  Weight: 104 lb (47.2 kg)  Height: 4\' 11"  (1.499 m)   Body mass index is 21.01 kg/m.     02/26/2023    8:12 AM 02/12/2021   11:07 AM 08/14/2020   11:22 AM 11/01/2019    1:59 PM 11/16/2018    2:06 PM  Advanced Directives  Does Patient Have a Medical Advance Directive? No Yes Yes No No  Type of Special educational needs teacher of [place];Living will Living will;Healthcare Power of Attorney    Does patient want to make changes to medical advance directive?  No - Patient declined No - Patient declined    Copy of Healthcare Power of Attorney in Chart?   No - copy requested    Would patient like information on creating a medical advance directive? No - Patient declined   No - Patient declined No - Patient declined    Current Medications (verified) Outpatient Encounter Medications as of 02/26/2023  Medication Sig    acetaminophen (TYLENOL) 500 MG tablet Take 500 mg by mouth as needed.   atorvastatin (LIPITOR) 40 MG tablet Take 1 tablet (40 mg total) by mouth daily.   azelastine (OPTIVAR) 0.05 % ophthalmic solution Place 1 drop into both eyes 2 (two) times daily.   calcium carbonate (TUMS - DOSED IN MG ELEMENTAL CALCIUM) 500 MG chewable tablet Chew 1 tablet (200 mg of elemental calcium total) by mouth as needed for indigestion or heartburn.   fluticasone (FLONASE) 50 MCG/ACT nasal spray Place 1 spray into both nostrils daily.   L-Methylfolate-B6-B12 (FOLTANX) 3-35-2 MG TABS Take 1 tablet by mouth twice daily   omeprazole (PRILOSEC) 40 MG capsule Take 1 capsule (40 mg total) by mouth daily.   No facility-administered encounter medications on file as of 02/26/2023.    Allergies (verified) Citrus   History: Past Medical History:  Diagnosis Date   Cancer (HCC)    Cervical   GERD (gastroesophageal reflux disease)    Hyperlipidemia    History reviewed. No pertinent surgical history. Family History  Problem Relation Age of Onset   Hypertension Mother    Diabetes Mother    Hyperlipidemia Mother    Social History   Socioeconomic History   Marital status: Legally Separated    Spouse name: Johnice Riebe   Number of children: 1   Years of education: HA   Highest education level: High school graduate  Occupational History   Occupation: Manicurist    Comment: Paediatric nurse  Occupation: Retired.  Tobacco Use   Smoking status: Never   Smokeless tobacco: Never  Vaping Use   Vaping status: Never Used  Substance and Sexual Activity   Alcohol use: No    Alcohol/week: 1.0 standard drink of alcohol    Types: 1 Cans of beer per week   Drug use: No   Sexual activity: Not on file  Other Topics Concern   Not on file  Social History Narrative   Lives alone. She has 1 child. She enjoys watching her phone and gardening.   Social Drivers of Corporate investment banker Strain: Low Risk  (02/26/2023)    Overall Financial Resource Strain (CARDIA)    Difficulty of Paying Living Expenses: Not hard at all  Food Insecurity: No Food Insecurity (02/26/2023)   Hunger Vital Sign    Worried About Running Out of Food in the Last Year: Never true    Ran Out of Food in the Last Year: Never true  Transportation Needs: No Transportation Needs (02/26/2023)   PRAPARE - Administrator, Civil Service (Medical): No    Lack of Transportation (Non-Medical): No  Physical Activity: Insufficiently Active (02/26/2023)   Exercise Vital Sign    Days of Exercise per Week: 7 days    Minutes of Exercise per Session: 20 min  Stress: No Stress Concern Present (02/26/2023)   [employer] of Occupational Health - Occupational Stress Questionnaire    Feeling of Stress : Not at all  Social Connections: Moderately Isolated (02/26/2023)   Social Connection and Isolation Panel [NHANES]    Frequency of Communication with Friends and Family: More than three times a week    Frequency of Social Gatherings with Friends and Family: More than three times a week    Attends Religious Services: More than 4 times per year    Active Member of [employer] or Organizations: No    Attends Banker Meetings: Never    Marital Status: Separated    Tobacco Counseling Counseling given: Not Answered   Clinical Intake:  Pre-visit preparation completed: Yes  Pain : No/denies pain     BMI - recorded: 21.01 Nutritional Status: BMI of 19-24  Normal Nutritional Risks: None Diabetes: No  What is the last grade level you completed in school?: 12  Interpreter Needed?: Yes  Comments: Patient's daughter, Cyril Railey, is on the call with her.   Activities of Daily Living    02/26/2023    8:04 AM  In your present state of health, do you have any difficulty performing the following activities:  Hearing? 0  Vision? 0  Difficulty concentrating or making decisions? 0  Walking or climbing stairs? 0  Dressing or  bathing? 0  Doing errands, shopping? 0  Preparing Food and eating ? N  Using the Toilet? N  In the past six months, have you accidently leaked urine? N  Do you have problems with loss of bowel control? N  Managing your Medications? N  Managing your Finances? N  Housekeeping or managing your Housekeeping? N    Patient Care Team: Nolene Ebbs as PCP - General (Family Medicine) Belva Agee, MD as Referring Physician (Gastroenterology)  Indicate any recent Medical Services you may have received from other than [facility] providers in the past year (date may be approximate).     Assessment:   This is a routine wellness examination for [place].  Hearing/Vision screen Hearing Screening - Comments:: Unable to test Vision Screening - Comments:: Unable  to test   Goals Addressed             This Visit's Progress    Activity and Exercise Increased       She would like to increase exercise to continue good health.      Depression Screen    02/10/2023   11:28 AM 02/10/2023   11:06 AM 04/09/2022    9:38 AM 08/14/2020   11:18 AM 06/02/2017   11:06 AM 02/19/2013    7:53 AM  PHQ 2/9 Scores  PHQ - 2 Score 0 0 0 0 2 0  PHQ- 9 Score 0    2     Fall Risk    02/26/2023    8:13 AM 02/10/2023    9:08 AM 04/09/2022    9:38 AM 08/14/2020   11:18 AM 02/19/2013    7:53 AM  Fall Risk   Falls in the past year? 0 0 0 0 No  Number falls in past yr: 0 0 0 0   Injury with Fall? 0 0 0 0   Risk for fall due to : No Fall Risks No Fall Risks No Fall Risks No Fall Risks   Follow up Falls evaluation completed Falls evaluation completed Falls evaluation completed Falls evaluation completed     MEDICARE RISK AT HOME: Medicare Risk at Home Any stairs in or around the home?: No If so, are there any without handrails?: No Home free of loose throw rugs in walkways, pet beds, electrical cords, etc?: Yes Adequate lighting in your home to reduce risk of falls?: Yes Life alert?: No Use of a cane, walker  or w/c?: No Grab bars in the bathroom?: No Shower chair or bench in shower?: No Elevated toilet seat or a handicapped toilet?: No  TIMED UP AND GO:  Was the test performed?  No    Cognitive Function:        08/14/2020   11:24 AM  6CIT Screen  What Year? 0 points  What month? 3 points  What time? 0 points  Count back from 20 0 points  Months in reverse 0 points  Repeat phrase 0 points  Total Score 3 points    Immunizations Immunization History  Administered Date(s) Administered   Influenza Split 10/15/2010   Janssen (J&J) SARS-COV-2 Vaccination 03/28/2019, 12/01/2019   PNEUMOCOCCAL CONJUGATE-20 04/09/2022   Tdap 02/17/2013    TDAP status: Due, Education has been provided regarding the importance of this vaccine. Advised may receive this vaccine at local pharmacy or Health Dept. Aware to provide a copy of the vaccination record if obtained from local pharmacy or Health Dept. Verbalized acceptance and understanding.  Flu Vaccine status: Due, Education has been provided regarding the importance of this vaccine. Advised may receive this vaccine at local pharmacy or Health Dept. Aware to provide a copy of the vaccination record if obtained from local pharmacy or Health Dept. Verbalized acceptance and understanding.  Pneumococcal vaccine status: Up to date  Covid-19 vaccine status: Declined, Education has been provided regarding the importance of this vaccine but patient still declined. Advised may receive this vaccine at local pharmacy or Health Dept.or vaccine clinic. Aware to provide a copy of the vaccination record if obtained from local pharmacy or Health Dept. Verbalized acceptance and understanding.  Qualifies for Shingles Vaccine? Yes   Zostavax completed No   Shingrix Completed?: No.    Education has been provided regarding the importance of this vaccine. Patient has been advised to call insurance company to determine  out of pocket expense if they have not yet received  this vaccine. Advised may also receive vaccine at local pharmacy or Health Dept. Verbalized acceptance and understanding.  Screening Tests Health Maintenance  Topic Date Due   MAMMOGRAM  02/28/2022   Colonoscopy  09/11/2022   DTaP/Tdap/Td (2 - Td or Tdap) 02/18/2023   COVID-19 Vaccine (3 - 2024-25 season) 02/26/2023 (Originally 09/15/2022)   INFLUENZA VACCINE  04/14/2023 (Originally 08/15/2022)   Zoster Vaccines- Shingrix (1 of 2) 05/11/2023 (Originally 06/08/1974)   Medicare Annual Wellness (AWV)  02/26/2024   Pneumonia Vaccine 44+ Years old  Completed   DEXA SCAN  Completed   Hepatitis C Screening  Completed   HPV VACCINES  Aged Out    Health Maintenance  Health Maintenance Due  Topic Date Due   MAMMOGRAM  02/28/2022   Colonoscopy  09/11/2022   DTaP/Tdap/Td (2 - Td or Tdap) 02/18/2023    Colorectal cancer screening: Type of screening: Colonoscopy. Completed 09/10/2016. Repeat every 5 years  Mammogram status: Ordered  . Pt provided with contact info and advised to call to schedule appt.  Patient scheduled on 04/02/2023 for mammogram.   Bone Density status: Ordered  . Pt provided with contact info and advised to call to schedule appt. Patient scheduled for bone density on 3/192025  Lung Cancer Screening: (Low Dose CT Chest recommended if Age 67-80 years, 20 pack-year currently smoking OR have quit w/in 15years.) does not qualify.   Lung Cancer Screening Referral: n/a  Additional Screening:  Hepatitis C Screening: does qualify; Completed 03/25/2016  Vision Screening: Recommended annual ophthalmology exams for early detection of glaucoma and other disorders of the eye. Is the patient up to date with their annual eye exam?  No  Who is the provider or what is the name of the office in which the patient attends annual eye exams? N/a If pt is not established with a provider, would they like to be referred to a provider to establish care? No .   Dental Screening: Recommended annual  dental exams for proper oral hygiene    Community Resource Referral / Chronic Care Management: CRR required this visit?  Yes   CCM required this visit?  No     Plan:     I have personally reviewed and noted the following in the patient's chart:   Medical and social history Use of alcohol, tobacco or illicit drugs  Current medications and supplements including opioid prescriptions. Patient is not currently taking opioid prescriptions. Functional ability and status Nutritional status Physical activity Advanced directives List of other physicians Hospitalizations, surgeries, and ER visits in previous 12 months Vitals Screenings to include cognitive, depression, and falls Referrals and appointments  In addition, I have reviewed and discussed with patient certain preventive protocols, quality metrics, and best practice recommendations. A written personalized care plan for preventive services as well as general preventive health recommendations were provided to patient.     Esmond Harps, CMA   02/26/2023   After Visit Summary: (MyChart) Due to this being a telephonic visit, the after visit summary with patients personalized plan was offered to patient via MyChart   Nurse Notes:   Haley Francis is a 68 y.o. female patient of Tandy Gaw, PA-C who had a Medicare Annual Wellness Visit today via telephone. Her daughter, Haley Francis, was there to interpret. Patient still loves gardening.   Recommended flu vaccine in office or at local pharmacy.  Recommended T-dap and Shingles vaccine at local pharmacy.  She is due  for a colonoscopy. A referral has been placed.

## 2023-03-04 NOTE — Therapy (Incomplete)
 OUTPATIENT PHYSICAL THERAPY THORACOLUMBAR EVALUATION   Patient Name: Haley Francis MRN: [id number] DOB:[birth year],67 y.o., female Today's Date: 03/04/2023   END OF SESSION:    Past Medical History:  Diagnosis Date   Cancer (HCC)    Cervical   GERD (gastroesophageal reflux disease)    Hyperlipidemia    No past surgical history on file. Patient Active Problem List   Diagnosis Date Noted   Muscle spasm 02/10/2023   Muscle weakness (generalized) 02/10/2023   Hypertriglyceridemia 04/10/2022   Elevated LDL cholesterol level 04/10/2022   Bilateral leg pain 04/09/2022   Post-menopausal 04/09/2022   Osteopenia 02/28/2021   Epigastric pain 02/09/2021   Myalgia 02/09/2021   Arthralgia 02/09/2021   Neuropathy 02/09/2021   History of chemotherapy 02/09/2021   Lumbar radiculopathy 02/09/2021   Bilateral shoulder pain 10/12/2019   Facet hypertrophy of lumbar region 10/12/2018   Cough 06/02/2017   Rectal bleeding 06/02/2017   Malignant neoplasm of exocervix (HCC) 03/25/2016   Neoplasm of female genital organ 10/27/2014   Abnormal Papanicolaou smear of cervix with positive human papilloma virus (HPV) test 10/27/2014   Pre-diabetes 06/23/2013   Polyp of colon, adenomatous 03/19/2013   Abnormal mammogram 02/28/2013    PCP: Jomarie Longs, PA-C   REFERRING PROVIDER: Jomarie Longs, PA-C   REFERRING DIAG:  R20.2 (ICD-10-CM) - Paresthesia of foot  M62.81 (ICD-10-CM) - Muscle weakness (generalized)  M62.838 (ICD-10-CM) - Muscle spasm    Rationale for Evaluation and Treatment: Rehabilitation  THERAPY DIAG:  No diagnosis found.  ONSET DATE: ***  SUBJECTIVE:                                                                                                                                                                                           SUBJECTIVE STATEMENT: ***  PERTINENT HISTORY:  History of chemotherapy  Cervical cancer  Neuropathy  Lumbar radiculopathy    PAIN:  Are you having pain? {OPRCPAIN:27236}  PRECAUTIONS: {Therapy precautions:24002}  WEIGHT BEARING RESTRICTIONS: No  FALLS:  Has patient fallen in last 6 months? {fallsyesno:27318}  LIVING ENVIRONMENT: Lives with: {[facility] lives with:25569::"lives with their family"} Lives in: {Lives in:25570} Stairs: {opstairs:27293} Has following equipment at home: {Assistive devices:23999}  OCCUPATION: ***  PLOF: {PLOF:24004}  PATIENT GOALS: ***   OBJECTIVE:  Note: Objective measures were completed at Evaluation unless otherwise noted.  DIAGNOSTIC FINDINGS:  None   PATIENT SURVEYS:  Patient-specific activity scoring scheme (Point to one number):  "0" represents "unable to perform." "10" represents "able to perform at prior level. 0 1 2 3 4 5 6 7 8 9  10 (Date and Score) Activity Initial  Activity Eval  Additional Additional Total score = sum of the activity scores/number of activities Minimum detectable change (90%CI) for average score = 2 points Minimum detectable change (90%CI) for single activity score = 3 points PSFS developed by: Jake Seats., & Binkley, J. (1995). Assessing disability and change on individual  patients: a report of a patient specific measure. Physiotherapy [place], [phone], [phone]. Reproduced with the permission of the authors  Score:  COGNITION: Overall cognitive status: {cognition:24006}     SENSATION: {sensation:27233}  MUSCLE LENGTH: Hamstrings: Right *** deg; Left *** deg Maisie Fus test: Right *** deg; Left *** deg  POSTURE: {posture:25561}  PALPATION: ***  LUMBAR ROM:   AROM eval  Flexion   Extension   Right lateral flexion   Left lateral flexion   Right rotation   Left rotation    (Blank rows = not tested)  LOWER EXTREMITY ROM:     Active  Right eval Left eval  Hip flexion    Hip extension    Hip abduction    Hip adduction    Hip internal rotation    Hip external rotation     Knee flexion    Knee extension    Ankle dorsiflexion    Ankle plantarflexion    Ankle inversion    Ankle eversion     (Blank rows = not tested)  LOWER EXTREMITY MMT:    MMT Right eval Left eval  Hip flexion    Hip extension    Hip abduction    Hip adduction    Hip internal rotation    Hip external rotation    Knee flexion    Knee extension    Ankle dorsiflexion    Ankle plantarflexion    Ankle inversion    Ankle eversion     (Blank rows = not tested)  SPECIAL TESTS:  ***  FUNCTIONAL TESTS:  {Functional tests:24029}  GAIT: Distance walked: *** Assistive device utilized: {Assistive devices:23999} Level of assistance: {Levels of assistance:24026} Comments: ***  [facility] Adult PT Treatment:                                                DATE: ***  Therapeutic Exercise: Demonstrated and issued initial HEP.   Manual Therapy: *** Neuromuscular re-ed: *** Therapeutic Activity: Education on assessment findings that will be addressed throughout duration of POC.   Modalities: *** Self Care: ***    PATIENT EDUCATION:  Education details: see treatment Person educated: Patient Education method: Explanation, Demonstration, Tactile cues, Verbal cues, and Handouts Education comprehension: verbalized understanding, returned demonstration, verbal cues required, tactile cues required, and needs further education  HOME EXERCISE PROGRAM: ***  ASSESSMENT:  CLINICAL IMPRESSION: Patient is a 68 y.o. female who was seen today for physical therapy evaluation and treatment for bilateral LE numbness/tingling. She has previously completed PT for similar complaint with improvement in her symptoms. Patient does have a history of lumbar radiculopathy as well as undergone chemotherapy in the past for cervical cancer. Etiology of her paraesthesia remains unclear with noted findgings today including ***. Patient will benefit from trial of PT to address the above stated deficits to  assist in addressing her paraesthesia.    OBJECTIVE IMPAIRMENTS: {opptimpairments:25111}.   ACTIVITY LIMITATIONS: {activitylimitations:27494}  PARTICIPATION LIMITATIONS: {participationrestrictions:25113}  PERSONAL FACTORS: {Personal factors:25162} are also affecting patient's functional outcome.   REHAB POTENTIAL: {rehabpotential:25112}  CLINICAL DECISION  MAKING: {clinical decision making:25114}  EVALUATION COMPLEXITY: High   GOALS: Goals reviewed with patient? Yes  SHORT TERM GOALS: Target date: ***  Patient will be independent and compliant with initial HEP.   Baseline: see above Goal status: INITIAL  2.  *** Baseline: see above  Goal status: INITIAL  3.  *** Baseline: see above  Goal status: INITIAL  4.  *** Baseline: see above  Goal status: INITIAL  LONG TERM GOALS: Target date: ***  *** Baseline: see above Goal status: INITIAL  2.  *** Baseline: see above Goal status: INITIAL  3.  *** Baseline: see above Goal status: INITIAL  4.  *** Baseline: see above Goal status: INITIAL  5.  *** Baseline: see above  Goal status: INITIAL  6.  *** Baseline:  Goal status: INITIAL  PLAN:  PT FREQUENCY: {rehab frequency:25116}  PT DURATION: {rehab duration:25117}  PLANNED INTERVENTIONS: {rehab planned interventions:25118::"97110-Therapeutic exercises","97530- Therapeutic [phone]- Neuromuscular re-education","97535- Self JXBJ","47829- Manual therapy"}.  PLAN FOR NEXT SESSION: review and progress HEP prn; ***   Letitia Libra, PT, DPT, ATC 03/04/23 4:35 PM

## 2023-03-05 ENCOUNTER — Encounter: Payer: Self-pay | Admitting: Physical Therapy

## 2023-03-05 ENCOUNTER — Ambulatory Visit: Payer: 59 | Attending: Physician Assistant | Admitting: Physical Therapy

## 2023-03-05 ENCOUNTER — Other Ambulatory Visit: Payer: Self-pay

## 2023-03-05 DIAGNOSIS — M62838 Other muscle spasm: Secondary | ICD-10-CM | POA: Diagnosis not present

## 2023-03-05 DIAGNOSIS — M6281 Muscle weakness (generalized): Secondary | ICD-10-CM | POA: Diagnosis not present

## 2023-03-05 DIAGNOSIS — R202 Paresthesia of skin: Secondary | ICD-10-CM | POA: Diagnosis not present

## 2023-03-05 DIAGNOSIS — R208 Other disturbances of skin sensation: Secondary | ICD-10-CM | POA: Diagnosis not present

## 2023-03-05 NOTE — Patient Instructions (Signed)

## 2023-03-05 NOTE — Therapy (Signed)
 OUTPATIENT PHYSICAL THERAPY LOWER EXTREMITY EVALUATION   Patient Name: Haley Francis MRN: [id number] DOB:1955-08-11, 68 y.o., female Today's Date: 03/05/2023  END OF SESSION:  PT End of Session - 03/05/23 1145     Visit Number 1    Number of Visits 16    Date for PT Re-Evaluation 04/30/23    Authorization Type [employer] Medicare    Authorization - Visit Number 1    Progress Note Due on Visit 10    PT Start Time 1100    PT Stop Time 1130    PT Time Calculation (min) 30 min    Activity Tolerance Patient tolerated treatment well    Behavior During Therapy WFL for tasks assessed/performed             Past Medical History:  Diagnosis Date   Cancer (HCC)    Cervical   GERD (gastroesophageal reflux disease)    Hyperlipidemia    History reviewed. No pertinent surgical history. Patient Active Problem List   Diagnosis Date Noted   Muscle spasm 02/10/2023   Muscle weakness (generalized) 02/10/2023   Hypertriglyceridemia 04/10/2022   Elevated LDL cholesterol level 04/10/2022   Bilateral leg pain 04/09/2022   Post-menopausal 04/09/2022   Osteopenia 02/28/2021   Epigastric pain 02/09/2021   Myalgia 02/09/2021   Arthralgia 02/09/2021   Neuropathy 02/09/2021   History of chemotherapy 02/09/2021   Lumbar radiculopathy 02/09/2021   Bilateral shoulder pain 10/12/2019   Facet hypertrophy of lumbar region 10/12/2018   Cough 06/02/2017   Rectal bleeding 06/02/2017   Malignant neoplasm of exocervix (HCC) 03/25/2016   Neoplasm of female genital organ 10/27/2014   Abnormal Papanicolaou smear of cervix with positive human papilloma virus (HPV) test 10/27/2014   Pre-diabetes 06/23/2013   Polyp of colon, adenomatous 03/19/2013   Abnormal mammogram 02/28/2013    PCP: Tandy Gaw  REFERRING PROVIDER: Tandy Gaw  REFERRING DIAG: parasthesia of foot, muscle weakness, muscle spasm  THERAPY DIAG:  Muscle weakness (generalized)  Other disturbances of skin sensation  Rationale  for Evaluation and Treatment: Rehabilitation  ONSET DATE: 01/2023  SUBJECTIVE:   SUBJECTIVE STATEMENT: Pt complains of numbness in bilat LEs from knee down to feet. She has had numbness in her feet and ankles for a long time but more recently it has been coming from the knees down. Pt states that pain decreases with use of icy hot and with some of the exercises from her previous PT sessions for this problem. Hamstring stretch and sidelying clam help the most. She states nothing really increases the pain, it is always there.  Pt's daughter present to interpret   PERTINENT HISTORY: Pt has been seen at this clinic for the same issue in the past PAIN:  Are you having pain?  Pt reports symptoms as "moderate"  PRECAUTIONS: None  RED FLAGS: None   WEIGHT BEARING RESTRICTIONS: No  FALLS:  Has patient fallen in last 6 months? No   OCCUPATION: retired  PLOF: Independent  PATIENT GOALS: decrease symptoms  NEXT MD VISIT: PRN  OBJECTIVE:  Note: Objective measures were completed at Evaluation unless otherwise noted.  PALPATION: Increased mm spasticity bilat anterior tib, gastroc/soleus  LOWER EXTREMITY ROM:  LE ROM WFL bilat  LOWER EXTREMITY MMT:  MMT Right eval Left eval  Hip flexion 4 4-  Hip extension    Hip abduction 4 4-  Hip adduction    Hip internal rotation    Hip external rotation    Knee flexion    Knee extension  Ankle dorsiflexion 3+ 3+  Ankle plantarflexion 3+ 3+  Ankle inversion 3+ 3+  Ankle eversion 3+ 3+   (Blank rows = not tested)  LOWER EXTREMITY SPECIAL TESTS:  Maisie Fus test (-) bilat  FUNCTIONAL TESTS:  SLS Rt 8 seconds, Lt 4 seconds Pain decreases slightly with lumbar flexion, increases with lumbar extension                                                                                                                                 TREATMENT DATE: 03/05/23 See HEP  Skilled palpation to assess response to dry needling Trigger Point  Dry Needling  Initial Treatment: Pt instructed on Dry Needling rational, procedures, and possible side effects. Pt instructed to expect mild to moderate muscle soreness later in the day and/or into the next day.  Pt instructed in methods to reduce muscle soreness. Pt instructed to continue prescribed HEP. Patient was educated on signs and symptoms of infection and other risk factors and advised to seek medical attention should they occur.  Patient verbalized understanding of these instructions and education.   Patient Verbal Consent Given: Yes Education Handout Provided: Previously Provided Muscles Treated: bilateral anterior tib, gastroc, soleus Electrical Stimulation Performed: No Treatment Response/Outcome: twitch response      PATIENT EDUCATION:  Education details: PT POC and goals, HEP Person educated: Patient Education method: Programmer, multimedia, Facilities manager, and Handouts Education comprehension: verbalized understanding and returned demonstration  HOME EXERCISE PROGRAM: Access Code: [email or web address] URL: [email or web address] Date: 03/05/2023 Prepared by: Reggy Eye  Exercises - Single Leg Stance with Support  - 1 x daily - 7 x weekly - 1 sets - 3 reps - 30 seconds hold - Seated Sciatic Tensioner  - 1 x daily - 7 x weekly - 2 sets - 10 reps  ASSESSMENT:  CLINICAL IMPRESSION: Patient is a 68 y.o. female who was seen today for physical therapy evaluation and treatment for paraesthesia of foot. She presents with c/o bilateral numbness from knees to bottom of feet. She presents with decreased strength and balance and will benefit from skilled PT to address deficits and improve functional mobility with decreased symptoms.  OBJECTIVE IMPAIRMENTS: decreased balance, decreased strength, and impaired sensation.   ACTIVITY LIMITATIONS:  none reported  PARTICIPATION LIMITATIONS:  none reported  PERSONAL FACTORS: Past/current experiences and Time since onset of  injury/illness/exacerbation are also affecting patient's functional outcome.   REHAB POTENTIAL: Good  CLINICAL DECISION MAKING: Evolving/moderate complexity  EVALUATION COMPLEXITY: Moderate   GOALS: Goals reviewed with patient? Yes  SHORT TERM GOALS: Target date: 04/02/2023   Pt will be independent in initial HEP Baseline: Goal status: INITIAL  2.  Pt will report 25% reduction in symptoms Baseline:  Goal status: INITIAL    LONG TERM GOALS: Target date: 04/30/2023    Pt will be independent in advanced HEP Baseline:  Goal status: INITIAL  2.  Pt will report 50% reduction in symptoms Baseline:  Goal status: INITIAL  3.  Pt will improve LE strength to 4+/5 to improve tolerance to functional mobility Baseline:  Goal status: INITIAL    PLAN:  PT FREQUENCY: 1-2x/week  PT DURATION: 8 weeks  PLANNED INTERVENTIONS: 97164- PT Re-evaluation, 97110-Therapeutic exercises, 97530- Therapeutic activity, [id number]- Neuromuscular re-education, 97535- Self Care, [id number]- Manual therapy, [id number]- Aquatic Therapy, [id number]- Electrical stimulation (unattended), Patient/Family education, Taping, Dry Needling, Cryotherapy, and Moist heat  PLAN FOR NEXT SESSION: assess response to dry needling. LE strength, sciatic nerve glides, balance   Ember Henrikson, PT 03/05/2023, 11:46 AM

## 2023-03-11 ENCOUNTER — Ambulatory Visit: Payer: 59 | Admitting: Physical Therapy

## 2023-03-11 ENCOUNTER — Encounter: Payer: Self-pay | Admitting: Physical Therapy

## 2023-03-11 DIAGNOSIS — M6281 Muscle weakness (generalized): Secondary | ICD-10-CM | POA: Diagnosis not present

## 2023-03-11 DIAGNOSIS — R208 Other disturbances of skin sensation: Secondary | ICD-10-CM | POA: Diagnosis not present

## 2023-03-11 DIAGNOSIS — M62838 Other muscle spasm: Secondary | ICD-10-CM | POA: Diagnosis not present

## 2023-03-11 DIAGNOSIS — R202 Paresthesia of skin: Secondary | ICD-10-CM | POA: Diagnosis not present

## 2023-03-11 NOTE — Therapy (Addendum)
 OUTPATIENT PHYSICAL THERAPY LOWER EXTREMITY TREATMENT AND DISCHARGE   Patient Name: Haley Francis MRN: [id number] DOB:[birth year], 68 y.o., female Today's Date: 03/11/2023  END OF SESSION:  PT End of Session - 03/11/23 1009     Visit Number 2    Number of Visits 16    Date for PT Re-Evaluation 04/30/23    Authorization Type [employer] Medicare    Authorization - Visit Number 2    Progress Note Due on Visit 10    PT Start Time 0933    PT Stop Time 1011    PT Time Calculation (min) 38 min    Activity Tolerance Patient tolerated treatment well    Behavior During Therapy WFL for tasks assessed/performed              Past Medical History:  Diagnosis Date   Cancer (HCC)    Cervical   GERD (gastroesophageal reflux disease)    Hyperlipidemia    History reviewed. No pertinent surgical history. Patient Active Problem List   Diagnosis Date Noted   Muscle spasm 02/10/2023   Muscle weakness (generalized) 02/10/2023   Hypertriglyceridemia 04/10/2022   Elevated LDL cholesterol level 04/10/2022   Bilateral leg pain 04/09/2022   Post-menopausal 04/09/2022   Osteopenia 02/28/2021   Epigastric pain 02/09/2021   Myalgia 02/09/2021   Arthralgia 02/09/2021   Neuropathy 02/09/2021   History of chemotherapy 02/09/2021   Lumbar radiculopathy 02/09/2021   Bilateral shoulder pain 10/12/2019   Facet hypertrophy of lumbar region 10/12/2018   Cough 06/02/2017   Rectal bleeding 06/02/2017   Malignant neoplasm of exocervix (HCC) 03/25/2016   Neoplasm of female genital organ 10/27/2014   Abnormal Papanicolaou smear of cervix with positive human papilloma virus (HPV) test 10/27/2014   Pre-diabetes 06/23/2013   Polyp of colon, adenomatous 03/19/2013   Abnormal mammogram 02/28/2013    PCP: Sandy Crumb  REFERRING PROVIDER: Sandy Crumb  REFERRING DIAG: parasthesia of foot, muscle weakness, muscle spasm  THERAPY DIAG:  Muscle weakness (generalized)  Other disturbances of skin  sensation  Rationale for Evaluation and Treatment: Rehabilitation  ONSET DATE: 01/2023  SUBJECTIVE:   SUBJECTIVE STATEMENT: Pt states she is feeling better than last session, less symptoms  Pt's daughter present to interpret   PERTINENT HISTORY: Pt has been seen at this clinic for the same issue in the past PAIN:  Are you having pain?  Pt reports symptoms as "a little"  PRECAUTIONS: None  RED FLAGS: None   WEIGHT BEARING RESTRICTIONS: No  FALLS:  Has patient fallen in last 6 months? No   OCCUPATION: retired  PLOF: Independent  PATIENT GOALS: decrease symptoms  NEXT MD VISIT: PRN  OBJECTIVE:  Note: Objective measures were completed at Evaluation unless otherwise noted.  PALPATION: Increased mm spasticity bilat anterior tib, gastroc/soleus  LOWER EXTREMITY ROM:  LE ROM WFL bilat  LOWER EXTREMITY MMT:  MMT Right eval Left eval  Hip flexion 4 4-  Hip extension    Hip abduction 4 4-  Hip adduction    Hip internal rotation    Hip external rotation    Knee flexion    Knee extension    Ankle dorsiflexion 3+ 3+  Ankle plantarflexion 3+ 3+  Ankle inversion 3+ 3+  Ankle eversion 3+ 3+   (Blank rows = not tested)  LOWER EXTREMITY SPECIAL TESTS:  Andy Bannister test (-) bilat  FUNCTIONAL TESTS:  SLS Rt 8 seconds, Lt 4 seconds Pain decreases slightly with lumbar flexion, increases with lumbar extension  [facility] Adult PT Treatment:                                                DATE: 03/11/23 Therapeutic Exercise/NMR: Treadmill 1.0 mph x 5 min Seated sciatic nerve glide x 10 bilat Heel raises toes in, out, straight all x 10 Hip abd red TB 2 x 10 Hip ext red TB 2 x 10 Tandem stance on foam 2 x 30 sec --> tandem stance on foam with UE reaching SLS 2 x 20 sec bilat - intermittent UE support Supine peroneal nerve glide x 10 bilat Supine  sciatic nerve glide x 10 bilat Manual Therapy: Skilled palpation to assess effects of dry needling Trigger Point Dry Needling  Subsequent Treatment: Instructions provided previously at initial dry needling treatment.   Patient Verbal Consent Given: Yes Education Handout Provided: Previously Provided Muscles Treated: bilat anterior tib, gastroc, soleus Electrical Stimulation Performed: No Treatment Response/Outcome: palpable increase in muscle length   TREATMENT DATE: 03/05/23 See HEP  Skilled palpation to assess response to dry needling Trigger Point Dry Needling  Initial Treatment: Pt instructed on Dry Needling rational, procedures, and possible side effects. Pt instructed to expect mild to moderate muscle soreness later in the day and/or into the next day.  Pt instructed in methods to reduce muscle soreness. Pt instructed to continue prescribed HEP. Patient was educated on signs and symptoms of infection and other risk factors and advised to seek medical attention should they occur.  Patient verbalized understanding of these instructions and education.   Patient Verbal Consent Given: Yes Education Handout Provided: Previously Provided Muscles Treated: bilateral anterior tib, gastroc, soleus Electrical Stimulation Performed: No Treatment Response/Outcome: twitch response      PATIENT EDUCATION:  Education details: PT POC and goals, HEP Person educated: Patient Education method: Programmer, multimedia, Facilities manager, and Handouts Education comprehension: verbalized understanding and returned demonstration  HOME EXERCISE PROGRAM: Access Code: [email or web address] URL: [email or web address] Date: 03/05/2023 Prepared by: Lowery Rue  Exercises - Single Leg Stance with Support  - 1 x daily - 7 x weekly - 1 sets - 3 reps - 30 seconds hold - Seated Sciatic Tensioner  - 1 x daily - 7 x weekly - 2 sets - 10 reps  ASSESSMENT:  CLINICAL IMPRESSION: Pt with good tolerance to  addition of strengthening exercises. She demos improved SLS and states she has been working on that as part of HEP. She continues to feel good relief with dry needling. Making good progress towards goals  OBJECTIVE IMPAIRMENTS: decreased balance, decreased strength, and impaired sensation.     GOALS: Goals reviewed with patient? Yes  SHORT TERM GOALS: Target date: 04/02/2023   Pt will be independent in initial HEP Baseline: Goal status: INITIAL  2.  Pt will report 25% reduction in symptoms Baseline:  Goal status: INITIAL    LONG TERM GOALS: Target date: 04/30/2023    Pt will be independent in advanced HEP Baseline:  Goal status: INITIAL  2.  Pt will report 50% reduction in symptoms Baseline:  Goal status: INITIAL  3.  Pt will improve LE strength to 4+/5 to improve tolerance to functional mobility Baseline:  Goal status: INITIAL    PLAN:  PT FREQUENCY: 1-2x/week  PT DURATION: 8 weeks  PLANNED INTERVENTIONS: 97164- PT Re-evaluation, 97110-Therapeutic exercises, 97530- Therapeutic activity, [id number]- Neuromuscular re-education, 97535- Self Care, 01601- Manual therapy, [id number]- Aquatic Therapy,  Z6010- Electrical stimulation (unattended), Patient/Family education, Taping, Dry Needling, Cryotherapy, and Moist heat  PLAN FOR NEXT SESSION: assess response to dry needling. LE strength, sciatic nerve glides, balance   Brenten Janney, PT 03/11/2023, 10:10 AM   PHYSICAL THERAPY DISCHARGE SUMMARY  Visits from Start of Care: 2  Current functional level related to goals / functional outcomes: Reduced symptoms, improved balance   Remaining deficits: See above   Education / Equipment: HEP   Patient agrees to discharge. Patient goals were not met. Patient is being discharged due to not returning since the last visit.  Lowery Rue, PT,DPT04/25/258:28 AM

## 2023-03-17 ENCOUNTER — Ambulatory Visit: Payer: 59 | Attending: Physician Assistant | Admitting: Rehabilitative and Restorative Service Providers"

## 2023-03-17 DIAGNOSIS — M6281 Muscle weakness (generalized): Secondary | ICD-10-CM | POA: Insufficient documentation

## 2023-03-17 DIAGNOSIS — M62838 Other muscle spasm: Secondary | ICD-10-CM | POA: Insufficient documentation

## 2023-03-17 DIAGNOSIS — R202 Paresthesia of skin: Secondary | ICD-10-CM | POA: Insufficient documentation

## 2023-03-17 DIAGNOSIS — R208 Other disturbances of skin sensation: Secondary | ICD-10-CM | POA: Insufficient documentation

## 2023-03-17 NOTE — Therapy (Deleted)
 OUTPATIENT PHYSICAL THERAPY LOWER EXTREMITY TREATMENT   Patient Name: Sol Odor MRN: [id number] DOB:05/09/1955, 68 y.o., female Today's Date: 03/17/2023  END OF SESSION:     Past Medical History:  Diagnosis Date   Cancer (HCC)    Cervical   GERD (gastroesophageal reflux disease)    Hyperlipidemia    No past surgical history on file. Patient Active Problem List   Diagnosis Date Noted   Muscle spasm 02/10/2023   Muscle weakness (generalized) 02/10/2023   Hypertriglyceridemia 04/10/2022   Elevated LDL cholesterol level 04/10/2022   Bilateral leg pain 04/09/2022   Post-menopausal 04/09/2022   Osteopenia 02/28/2021   Epigastric pain 02/09/2021   Myalgia 02/09/2021   Arthralgia 02/09/2021   Neuropathy 02/09/2021   History of chemotherapy 02/09/2021   Lumbar radiculopathy 02/09/2021   Bilateral shoulder pain 10/12/2019   Facet hypertrophy of lumbar region 10/12/2018   Cough 06/02/2017   Rectal bleeding 06/02/2017   Malignant neoplasm of exocervix (HCC) 03/25/2016   Neoplasm of female genital organ 10/27/2014   Abnormal Papanicolaou smear of cervix with positive human papilloma virus (HPV) test 10/27/2014   Pre-diabetes 06/23/2013   Polyp of colon, adenomatous 03/19/2013   Abnormal mammogram 02/28/2013    PCP: Tandy Gaw  REFERRING PROVIDER: Tandy Gaw  REFERRING DIAG: parasthesia of foot, muscle weakness, muscle spasm  THERAPY DIAG:  No diagnosis found.  Rationale for Evaluation and Treatment: Rehabilitation  ONSET DATE: 01/2023  SUBJECTIVE:   SUBJECTIVE STATEMENT: Pt states she is feeling better than last session, less symptoms  Pt's daughter present to interpret   PERTINENT HISTORY: Pt has been seen at this clinic for the same issue in the past PAIN:  Are you having pain?  Pt reports symptoms as "a little"  PRECAUTIONS: None  RED FLAGS: None   WEIGHT BEARING RESTRICTIONS: No  FALLS:  Has patient fallen in last 6 months?  No   OCCUPATION: retired  PLOF: Independent  PATIENT GOALS: decrease symptoms  NEXT MD VISIT: PRN  OBJECTIVE:  Note: Objective measures were completed at Evaluation unless otherwise noted.  PALPATION: Increased mm spasticity bilat anterior tib, gastroc/soleus  LOWER EXTREMITY ROM:  LE ROM WFL bilat  LOWER EXTREMITY MMT:  MMT Right eval Left eval  Hip flexion 4 4-  Hip extension    Hip abduction 4 4-  Hip adduction    Hip internal rotation    Hip external rotation    Knee flexion    Knee extension    Ankle dorsiflexion 3+ 3+  Ankle plantarflexion 3+ 3+  Ankle inversion 3+ 3+  Ankle eversion 3+ 3+   (Blank rows = not tested)  LOWER EXTREMITY SPECIAL TESTS:  Maisie Fus test (-) bilat  FUNCTIONAL TESTS:  SLS Rt 8 seconds, Lt 4 seconds Pain decreases slightly with lumbar flexion, increases with lumbar extension   [facility] Adult PT Treatment:                                                DATE: 03/17/23 Therapeutic Exercise: *** Manual Therapy: *** Neuromuscular re-ed: *** Therapeutic Activity: *** Gait: *** Modalities: *** Self Care: ***  [facility] Adult PT Treatment:                                                DATE: 03/11/23 Therapeutic Exercise/NMR: Treadmill 1.0 mph x 5 min Seated sciatic nerve glide x 10 bilat Heel raises toes in, out, straight all x 10 Hip abd red TB 2 x 10 Hip ext red TB 2 x 10 Tandem stance on foam 2 x 30 sec --> tandem stance on foam with UE reaching SLS 2 x 20 sec bilat - intermittent UE support Supine peroneal nerve glide x 10 bilat Supine sciatic nerve glide x 10 bilat Manual Therapy: Skilled palpation to assess effects of dry needling Trigger Point Dry Needling  Subsequent Treatment: Instructions provided previously at initial dry needling treatment.   Patient Verbal Consent Given: Yes Education  Handout Provided: Previously Provided Muscles Treated: bilat anterior tib, gastroc, soleus Electrical Stimulation Performed: No Treatment Response/Outcome: palpable increase in muscle length   TREATMENT DATE: 03/05/23 See HEP  Skilled palpation to assess response to dry needling Trigger Point Dry Needling  Initial Treatment: Pt instructed on Dry Needling rational, procedures, and possible side effects. Pt instructed to expect mild to moderate muscle soreness later in the day and/or into the next day.  Pt instructed in methods to reduce muscle soreness. Pt instructed to continue prescribed HEP. Patient was educated on signs and symptoms of infection and other risk factors and advised to seek medical attention should they occur.  Patient verbalized understanding of these instructions and education.   Patient Verbal Consent Given: Yes Education Handout Provided: Previously Provided Muscles Treated: bilateral anterior tib, gastroc, soleus Electrical Stimulation Performed: No Treatment Response/Outcome: twitch response      PATIENT EDUCATION:  Education details: PT POC and goals, HEP Person educated: Patient Education method: Programmer, multimedia, Facilities manager, and Handouts Education comprehension: verbalized understanding and returned demonstration  HOME EXERCISE PROGRAM: Access Code: [email or web address] URL: [email or web address] Date: 03/05/2023 Prepared by: Reggy Eye  Exercises - Single Leg Stance with Support  - 1 x daily - 7 x weekly - 1 sets - 3 reps - 30 seconds hold - Seated Sciatic Tensioner  - 1 x daily - 7 x weekly - 2 sets - 10 reps  ASSESSMENT:  CLINICAL IMPRESSION: Pt with good tolerance to addition of strengthening exercises. She demos improved SLS and states she has been working on that as part of HEP. She continues to feel good relief with dry needling. Making good progress towards goals  OBJECTIVE IMPAIRMENTS: decreased balance, decreased strength, and  impaired sensation.     GOALS: Goals reviewed with patient? Yes  SHORT TERM GOALS: Target date: 04/02/2023   Pt will be independent in initial HEP Baseline: Goal status: INITIAL  2.  Pt will report 25% reduction in symptoms Baseline:  Goal status: INITIAL    LONG TERM GOALS: Target date: 04/30/2023    Pt will be independent in advanced HEP Baseline:  Goal status: INITIAL  2.  Pt will report 50% reduction in symptoms Baseline:  Goal status: INITIAL  3.  Pt will improve LE strength to 4+/5 to improve tolerance to functional mobility Baseline:  Goal status: INITIAL    PLAN:  PT FREQUENCY: 1-2x/week  PT DURATION: 8 weeks  PLANNED INTERVENTIONS: 97164- PT Re-evaluation, 97110-Therapeutic exercises, 97530- Therapeutic activity, [id number]- Neuromuscular re-education, 97535- Self Care, 47829- Manual therapy, [id number]- Aquatic Therapy,  W0981- Electrical stimulation (unattended), Patient/Family education, Taping, Dry Needling, Cryotherapy, and Moist heat  PLAN FOR NEXT SESSION: assess response to dry needling. LE strength, sciatic nerve glides, balance   Eisha Chatterjee, PT 03/17/2023, 10:09 AM

## 2023-04-02 ENCOUNTER — Ambulatory Visit: Payer: 59

## 2023-04-02 ENCOUNTER — Other Ambulatory Visit: Payer: 59

## 2023-05-14 ENCOUNTER — Ambulatory Visit

## 2023-05-14 ENCOUNTER — Other Ambulatory Visit

## 2023-05-23 ENCOUNTER — Other Ambulatory Visit: Payer: Self-pay | Admitting: Physician Assistant

## 2023-05-23 DIAGNOSIS — Z1231 Encounter for screening mammogram for malignant neoplasm of breast: Secondary | ICD-10-CM

## 2023-07-10 ENCOUNTER — Telehealth: Payer: Self-pay

## 2023-07-10 NOTE — Telephone Encounter (Signed)
 Copied from CRM [id number]. Topic: Referral - Status >> Jul 10, 2023 10:14 AM Diannia H wrote: Reason for CRM: Patient called and wanted to get the status of her referral for a nerve doctor, she stated that its been a few months and she know sometime it can take a while but just wanted to follow up, looked up referrals and I did not see one, could you please assist? Patients callback number is [phone].

## 2023-07-10 NOTE — Telephone Encounter (Signed)
 Forwarding message to Dr. Alvan covering Haley Francis In checking chart did not see referral either. Patient last visit was Feb 10, 2023  Notes in chart for this date state Will order EMG testing for right foot neuropathy Consider gabapentin for numbness and tingling

## 2023-07-11 NOTE — Telephone Encounter (Signed)
 Order is technically in the chart from January.  Do we need to repeat do this?  Okay to place another order just copy exactly what was on the 1 from January.

## 2023-07-14 NOTE — Telephone Encounter (Signed)
 EMG is under procedures not referrals so I would be unable to release it.

## 2023-07-15 NOTE — Telephone Encounter (Signed)
 07/15/2023-Referral and clinical notes have been faxed to [facility] through Epic. Office will contact patient to schedule referral appointment.   Letter sent to patient via mychart with referral contact office.

## 2023-07-25 ENCOUNTER — Encounter: Payer: Self-pay | Admitting: Neurology

## 2023-07-25 ENCOUNTER — Other Ambulatory Visit: Payer: Self-pay

## 2023-07-25 DIAGNOSIS — R202 Paresthesia of skin: Secondary | ICD-10-CM

## 2023-08-05 ENCOUNTER — Other Ambulatory Visit: Payer: Self-pay | Admitting: Physician Assistant

## 2023-08-05 DIAGNOSIS — R202 Paresthesia of skin: Secondary | ICD-10-CM

## 2023-08-05 DIAGNOSIS — M6281 Muscle weakness (generalized): Secondary | ICD-10-CM

## 2023-08-05 DIAGNOSIS — M62838 Other muscle spasm: Secondary | ICD-10-CM

## 2023-08-11 ENCOUNTER — Ambulatory Visit: Payer: 59 | Admitting: Physician Assistant

## 2023-09-08 ENCOUNTER — Ambulatory Visit (INDEPENDENT_AMBULATORY_CARE_PROVIDER_SITE_OTHER): Admitting: Neurology

## 2023-09-08 DIAGNOSIS — R202 Paresthesia of skin: Secondary | ICD-10-CM

## 2023-09-08 NOTE — Procedures (Signed)
 [facility]  [address] [place], [address]  [place], [place] [address] Tel: [phone] Fax: [phone] Test Date:  09/08/2023  Patient: Haley Francis DOB: 1956/01/10 Physician: Venetia Potters, MD  Sex: Female Height: 4' 11 Ref Phys: Vermell Bologna, PA-C  ID#: [id number]   Technician:    History: This is a 68 year old female with numbness and tingling of the bilateral feet.  NCV & EMG Findings: Extensive electrodiagnostic evaluation of bilateral lower limbs shows: Bilateral sural and superficial peroneal/fibular sensory responses are within normal limits. Bilateral peroneal/fibular (EDB) and tibial (AH) motor responses are within normal limits. Bilateral H reflex latency is within normal limits. There is no evidence of active or chronic motor axon loss changes affecting any of the tested muscles. Motor unit configuration and recruitment pattern is within normal limits.  Impression: This is a normal study of bilateral lower limbs. In particular, there is no electrodiagnostic evidence of a right or left lumbosacral (L3-S1) radiculopathy, large fiber sensorimotor neuropathy, or myopathy.    ___________________________ Venetia Potters, MD    Nerve Conduction Studies Motor Nerve Results    Latency Amplitude F-Lat Segment Distance CV Comment  Site (ms) Norm (mV) Norm (ms)  (cm) (m/s) Norm   Left Fibular (EDB) Motor  Ankle 2.7  < 6.0 4.6  > 2.5        Bel fib head 7.8 - 4.3 -  Bel fib head-Ankle 24 47  > 40   Pop fossa 9.5 - 3.9 -  Pop fossa-Bel fib head 8 47 -   Right Fibular (EDB) Motor  Ankle 2.5  < 6.0 5.0  > 2.5        Bel fib head 7.3 - 4.9 -  Bel fib head-Ankle 22 46  > 40   Pop fossa 8.9 - 4.8 -  Pop fossa-Bel fib head 8 50 -   Left Tibial (AH) Motor  Ankle 3.7  < 6.0 8.0  > 4.0        Knee 9.9 - 6.4 -  Knee-Ankle 33 53  > 40   Right Tibial (AH) Motor  Ankle 4.1  < 6.0 9.3  > 4.0        Knee 9.9 - 8.2 -  Knee-Ankle 32 55  > 40    Sensory Sites    Neg Peak Lat  Amplitude (O-P) Segment Distance Velocity Comment  Site (ms) Norm (V) Norm  (cm) (ms)   Left Superficial Fibular Sensory  14 cm-Ankle 2.3  < 4.6 8  > 3 14 cm-Ankle 14    Right Superficial Fibular Sensory  14 cm-Ankle 2.4  < 4.6 6  > 3 14 cm-Ankle 14    Left Sural Sensory  Calf-Lat mall 3.0  < 4.6 7  > 3 Calf-Lat mall 14    Right Sural Sensory  Calf-Lat mall 3.4  < 4.6 7  > 3 Calf-Lat mall 14     H-Reflex Results    M-Lat H Lat H Neg Amp H-M Lat  Site (ms) (ms) Norm (mV) (ms)  Left Tibial H-Reflex  Pop fossa 5.0 29.3  < 35.0 0.95 24.3  Right Tibial H-Reflex  Pop fossa 4.4 29.0  < 35.0 1.93 24.6   Electromyography   Side Muscle Ins.Act Fibs Fasc Recrt Amp Dur Poly Activation Comment  Left Tib ant Nml Nml Nml Nml Nml Nml Nml Nml N/A  Left Gastroc MH Nml Nml Nml Nml Nml Nml Nml Nml N/A  Left FDL Nml Nml Nml Nml Nml  Nml Nml Nml N/A  Left Rectus fem Nml Nml Nml Nml Nml Nml Nml Nml N/A  Left Biceps fem SH Nml Nml Nml Nml Nml Nml Nml Nml N/A  Left Gluteus med Nml Nml Nml Nml Nml Nml Nml Nml N/A  Right Tib ant Nml Nml Nml Nml Nml Nml Nml Nml N/A  Right Gastroc MH Nml Nml Nml Nml Nml Nml Nml Nml N/A  Right FDL Nml Nml Nml Nml Nml Nml Nml Nml N/A  Right Rectus fem Nml Nml Nml Nml Nml Nml Nml Nml N/A  Right Biceps fem SH Nml Nml Nml Nml Nml Nml Nml Nml N/A  Right Gluteus med Nml Nml Nml Nml Nml Nml Nml Nml N/A      Waveforms:  Motor           Sensory           H-Reflex

## 2023-09-11 NOTE — Progress Notes (Signed)
 Pharmacy Quality Measure Review  This patient is appearing on a report for being at risk of failing the adherence measure for cholesterol (statin) medications this calendar year.   Medication: atorvastatin  40 mg daily Last fill date: 09/04/2023 for 90 day supply  Insurance report was not up to date. No action needed at this time.   Woodie Jock, PharmD PGY1 Pharmacy Resident

## 2023-09-17 ENCOUNTER — Ambulatory Visit: Payer: Self-pay | Admitting: Physician Assistant

## 2023-09-17 NOTE — Progress Notes (Signed)
 Your EMG of bilateral feet tingling showed no signs of neuropathy or myopathy. This is great news!

## 2023-11-18 ENCOUNTER — Other Ambulatory Visit: Payer: Self-pay | Admitting: Physician Assistant

## 2023-11-18 DIAGNOSIS — Z9221 Personal history of antineoplastic chemotherapy: Secondary | ICD-10-CM

## 2023-11-18 DIAGNOSIS — G629 Polyneuropathy, unspecified: Secondary | ICD-10-CM

## 2023-11-22 ENCOUNTER — Other Ambulatory Visit: Payer: Self-pay | Admitting: Physician Assistant

## 2023-11-22 DIAGNOSIS — G629 Polyneuropathy, unspecified: Secondary | ICD-10-CM

## 2023-11-22 DIAGNOSIS — Z9221 Personal history of antineoplastic chemotherapy: Secondary | ICD-10-CM

## 2023-12-02 ENCOUNTER — Encounter: Payer: Self-pay | Admitting: Physician Assistant

## 2023-12-02 DIAGNOSIS — Z9221 Personal history of antineoplastic chemotherapy: Secondary | ICD-10-CM

## 2023-12-02 DIAGNOSIS — G629 Polyneuropathy, unspecified: Secondary | ICD-10-CM

## 2023-12-02 MED ORDER — FOLTANX 3-35-2 MG PO TABS: 1.0000 | ORAL_TABLET | Freq: Two times a day (BID) | ORAL | 5 refills | Status: AC

## 2024-01-06 ENCOUNTER — Encounter: Payer: Self-pay | Admitting: *Deleted

## 2024-01-06 NOTE — Progress Notes (Signed)
 Haley Francis                                          MRN: [id number]   01/06/2024   The [facility] Quality Team Specialist reviewed this patient medical record for the purposes of chart review for care gap closure. The following were reviewed: chart review for care gap closure-breast cancer screening.    [facility] Quality Team

## 2024-02-06 ENCOUNTER — Other Ambulatory Visit: Payer: Self-pay | Admitting: Physician Assistant

## 2024-02-06 DIAGNOSIS — Z13820 Encounter for screening for osteoporosis: Secondary | ICD-10-CM

## 2024-03-02 ENCOUNTER — Ambulatory Visit
